# Patient Record
Sex: Male | Born: 1965 | Race: White | Hispanic: No | Marital: Married | Smoking: Never smoker
Health system: Southern US, Community
[De-identification: ages and names within clinical notes are randomized; demographics above are authoritative.]

## PROBLEM LIST (undated history)

## (undated) DIAGNOSIS — R51 Headache: Secondary | ICD-10-CM

## (undated) DIAGNOSIS — I10 Essential (primary) hypertension: Secondary | ICD-10-CM

## (undated) DIAGNOSIS — J189 Pneumonia, unspecified organism: Secondary | ICD-10-CM

## (undated) DIAGNOSIS — N189 Chronic kidney disease, unspecified: Secondary | ICD-10-CM

## (undated) HISTORY — DX: Headache: R51

## (undated) HISTORY — PX: INGUINAL HERNIA REPAIR: SUR1180

## (undated) HISTORY — DX: Chronic kidney disease, unspecified: N18.9

## (undated) HISTORY — PX: OTHER SURGICAL HISTORY: SHX169

---

## 2009-06-25 ENCOUNTER — Encounter: Admission: RE | Admit: 2009-06-25 | Discharge: 2009-06-25 | Payer: Self-pay | Admitting: Internal Medicine

## 2010-07-21 HISTORY — PX: PELVIC FRACTURE SURGERY: SHX119

## 2010-08-11 ENCOUNTER — Encounter: Payer: Self-pay | Admitting: Internal Medicine

## 2011-04-05 ENCOUNTER — Emergency Department (HOSPITAL_BASED_OUTPATIENT_CLINIC_OR_DEPARTMENT_OTHER): Payer: BC Managed Care – PPO

## 2011-04-05 ENCOUNTER — Emergency Department (INDEPENDENT_AMBULATORY_CARE_PROVIDER_SITE_OTHER): Payer: BC Managed Care – PPO

## 2011-04-05 ENCOUNTER — Emergency Department (HOSPITAL_BASED_OUTPATIENT_CLINIC_OR_DEPARTMENT_OTHER)
Admission: EM | Admit: 2011-04-05 | Discharge: 2011-04-05 | Disposition: A | Discharge status: Other Acute Inpatient Hospital | Payer: BC Managed Care – PPO | Attending: Emergency Medicine | Admitting: Emergency Medicine

## 2011-04-05 ENCOUNTER — Encounter: Payer: Self-pay | Admitting: Emergency Medicine

## 2011-04-05 DIAGNOSIS — S3729XA Other injury of bladder, initial encounter: Secondary | ICD-10-CM

## 2011-04-05 DIAGNOSIS — S322XXA Fracture of coccyx, initial encounter for closed fracture: Secondary | ICD-10-CM

## 2011-04-05 DIAGNOSIS — S329XXA Fracture of unspecified parts of lumbosacral spine and pelvis, initial encounter for closed fracture: Secondary | ICD-10-CM | POA: Insufficient documentation

## 2011-04-05 DIAGNOSIS — S36899A Unspecified injury of other intra-abdominal organs, initial encounter: Secondary | ICD-10-CM

## 2011-04-05 DIAGNOSIS — I10 Essential (primary) hypertension: Secondary | ICD-10-CM | POA: Insufficient documentation

## 2011-04-05 DIAGNOSIS — Y9239 Other specified sports and athletic area as the place of occurrence of the external cause: Secondary | ICD-10-CM | POA: Insufficient documentation

## 2011-04-05 DIAGNOSIS — S3720XA Unspecified injury of bladder, initial encounter: Secondary | ICD-10-CM | POA: Insufficient documentation

## 2011-04-05 DIAGNOSIS — R109 Unspecified abdominal pain: Secondary | ICD-10-CM

## 2011-04-05 DIAGNOSIS — J3489 Other specified disorders of nose and nasal sinuses: Secondary | ICD-10-CM

## 2011-04-05 DIAGNOSIS — S3730XA Unspecified injury of urethra, initial encounter: Secondary | ICD-10-CM | POA: Insufficient documentation

## 2011-04-05 HISTORY — DX: Pneumonia, unspecified organism: J18.9

## 2011-04-05 HISTORY — DX: Essential (primary) hypertension: I10

## 2011-04-05 LAB — URINE MICROSCOPIC-ADD ON

## 2011-04-05 LAB — URINALYSIS, ROUTINE W REFLEX MICROSCOPIC
Bilirubin Urine: NEGATIVE
Hgb urine dipstick: NEGATIVE
Nitrite: NEGATIVE
Protein, ur: 30 mg/dL — AB
Specific Gravity, Urine: 1.018 (ref 1.005–1.030)
Urobilinogen, UA: 0.2 mg/dL (ref 0.0–1.0)

## 2011-04-05 MED ORDER — IOHEXOL 300 MG/ML  SOLN
100.0000 mL | Freq: Once | INTRAMUSCULAR | Status: AC | PRN
  Administered 2011-04-05: 100 mL via INTRAVENOUS

## 2011-04-05 MED ORDER — TETANUS-DIPHTH-ACELL PERTUSSIS 5-2.5-18.5 LF-MCG/0.5 IM SUSP
INTRAMUSCULAR | Status: AC
  Filled 2011-04-05: qty 0.5

## 2011-04-05 MED ORDER — TETANUS-DIPHTHERIA TOXOIDS TD 5-2 LFU IM INJ
0.5000 mL | INJECTION | Freq: Once | INTRAMUSCULAR | Status: AC
  Administered 2011-04-05: 0.5 mL via INTRAMUSCULAR
  Filled 2011-04-05: qty 0.5

## 2011-04-05 MED ORDER — SODIUM CHLORIDE 0.9 % IV SOLN
Freq: Once | INTRAVENOUS | Status: AC
  Administered 2011-04-05: 13:00:00 via INTRAVENOUS

## 2011-04-05 MED ORDER — SODIUM CHLORIDE 0.9 % IV SOLN: Freq: Once | INTRAVENOUS | Status: DC

## 2011-04-05 NOTE — ED Provider Notes (Signed)
History/physical exam/procedure(s) were performed by non-physician practitioner and as supervising physician I was immediately available for consultation/collaboration. I have reviewed all notes and am in agreement with care and plan.   Hilario Quarry, MD 04/05/11 [phone]

## 2011-04-05 NOTE — ED Notes (Signed)
Family at bedside.Care plan reviewed with patient
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
## 2012-11-04 ENCOUNTER — Ambulatory Visit (INDEPENDENT_AMBULATORY_CARE_PROVIDER_SITE_OTHER): Payer: BC Managed Care – PPO | Admitting: Family Medicine

## 2012-11-04 ENCOUNTER — Encounter: Payer: Self-pay | Admitting: Family Medicine

## 2012-11-04 VITALS — BP 138/78 | HR 72 | Temp 98.6°F | Resp 12 | Ht 69.0 in | Wt 215.0 lb

## 2012-11-04 DIAGNOSIS — I10 Essential (primary) hypertension: Secondary | ICD-10-CM
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
## 2012-11-05 ENCOUNTER — Encounter: Payer: Self-pay | Admitting: Family Medicine

## 2012-11-05 DIAGNOSIS — I10 Essential (primary) hypertension: Secondary | ICD-10-CM | POA: Insufficient documentation
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
## 2013-02-22 ENCOUNTER — Other Ambulatory Visit (INDEPENDENT_AMBULATORY_CARE_PROVIDER_SITE_OTHER): Payer: BC Managed Care – PPO

## 2013-02-22 ENCOUNTER — Encounter: Payer: Self-pay | Admitting: Family Medicine

## 2013-02-22 DIAGNOSIS — Z0000 Encounter for general adult medical examination without abnormal findings: Secondary | ICD-10-CM

## 2013-02-22 DIAGNOSIS — I10 Essential (primary) hypertension: Secondary | ICD-10-CM

## 2013-02-22 LAB — LIPID PANEL
LDL Cholesterol: 101 mg/dL — ABNORMAL HIGH (ref 0–99)
Total CHOL/HDL Ratio: 4
Triglycerides: 185 mg/dL — ABNORMAL HIGH (ref 0.0–149.0)

## 2013-02-22 LAB — CBC WITH DIFFERENTIAL/PLATELET
Basophils Relative: 0.5 % (ref 0.0–3.0)
Eosinophils Relative: 2.2 % (ref 0.0–5.0)
HCT: 43.2 % (ref 39.0–52.0)
Hemoglobin: 14.7 g/dL (ref 13.0–17.0)
Lymphocytes Relative: 38.4 % (ref 12.0–46.0)
Lymphs Abs: 2 10*3/uL (ref 0.7–4.0)
Monocytes Relative: 8.3 % (ref 3.0–12.0)
Neutro Abs: 2.6 10*3/uL (ref 1.4–7.7)
RBC: 4.69 Mil/uL (ref 4.22–5.81)
RDW: 12.9 % (ref 11.5–14.6)

## 2013-02-22 LAB — POCT URINALYSIS DIPSTICK
Bilirubin, UA: NEGATIVE
Blood, UA: NEGATIVE
Glucose, UA: NEGATIVE
Leukocytes, UA: NEGATIVE
Nitrite, UA: NEGATIVE
Urobilinogen, UA: 0.2

## 2013-02-22 LAB — BASIC METABOLIC PANEL
BUN: 13 mg/dL (ref 6–23)
CO2: 26 mEq/L (ref 19–32)
Calcium: 9.7 mg/dL (ref 8.4–10.5)
Chloride: 102 mEq/L (ref 96–112)
Creatinine, Ser: 1.2 mg/dL (ref 0.4–1.5)
Glucose, Bld: 93 mg/dL (ref 70–99)

## 2013-02-22 LAB — HEPATIC FUNCTION PANEL
ALT: 39 U/L (ref 0–53)
Total Bilirubin: 0.6 mg/dL (ref 0.3–1.2)
Total Protein: 7.6 g/dL (ref 6.0–8.3)

## 2013-02-28 ENCOUNTER — Encounter: Payer: Self-pay | Admitting: Family Medicine

## 2013-02-28 ENCOUNTER — Ambulatory Visit (INDEPENDENT_AMBULATORY_CARE_PROVIDER_SITE_OTHER): Payer: BC Managed Care – PPO | Admitting: Family Medicine

## 2013-02-28 VITALS — BP 136/78 | HR 87 | Temp 98.6°F | Ht 69.0 in | Wt 211.0 lb

## 2013-02-28 DIAGNOSIS — Z0000 Encounter for general adult medical examination without abnormal findings: Secondary | ICD-10-CM

## 2013-02-28 MED ORDER — OLMESARTAN MEDOXOMIL 20 MG PO TABS: 20.0000 mg | ORAL_TABLET | Freq: Every day | ORAL | Status: DC
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
## 2013-02-28 NOTE — Patient Instructions (Addendum)
Hypertriglyceridemia  Diet for High blood levels of Triglycerides Most fats in food are triglycerides. Triglycerides in your blood are stored as fat in your body. High levels of triglycerides in your blood may put you at a greater risk for heart disease and stroke.  Normal triglyceride levels are less than 150 mg/dL. Borderline high levels are 150-199 mg/dl. High levels are 200 - 499 mg/dL, and very high triglyceride levels are greater than 500 mg/dL. The decision to treat high triglycerides is generally based on the level. For people with borderline or high triglyceride levels, treatment includes weight loss and exercise. Drugs are recommended for people with very high triglyceride levels. Many people who need treatment for high triglyceride levels have metabolic syndrome. This syndrome is a collection of disorders that often include: insulin resistance, high blood pressure, blood clotting problems, high cholesterol and triglycerides. TESTING PROCEDURE FOR TRIGLYCERIDES  You should not eat 4 hours before getting your triglycerides measured. The normal range of triglycerides is between 10 and 250 milligrams per deciliter (mg/dl). Some people may have extreme levels (1000 or above), but your triglyceride level may be too high if it is above 150 mg/dl, depending on what other risk factors you have for heart disease.  People with high blood triglycerides may also have high blood cholesterol levels. If you have high blood cholesterol as well as high blood triglycerides, your risk for heart disease is probably greater than if you only had high triglycerides. High blood cholesterol is one of the main risk factors for heart disease. CHANGING YOUR DIET  Your weight can affect your blood triglyceride level. If you are more than 20% above your ideal body weight, you may be able to lower your blood triglycerides by losing weight. Eating less and exercising regularly is the best way to combat this. Fat provides more  calories than any other food. The best way to lose weight is to eat less fat. Only 30% of your total calories should come from fat. Less than 7% of your diet should come from saturated fat. A diet low in fat and saturated fat is the same as a diet to decrease blood cholesterol. By eating a diet lower in fat, you may lose weight, lower your blood cholesterol, and lower your blood triglyceride level.  Eating a diet low in fat, especially saturated fat, may also help you lower your blood triglyceride level. Ask your dietitian to help you figure how much fat you can eat based on the number of calories your caregiver has prescribed for you.  Exercise, in addition to helping with weight loss may also help lower triglyceride levels.   Alcohol can increase blood triglycerides. You may need to stop drinking alcoholic beverages.  Too much carbohydrate in your diet may also increase your blood triglycerides. Some complex carbohydrates are necessary in your diet. These may include bread, rice, potatoes, other starchy vegetables and cereals.  Reduce "simple" carbohydrates. These may include pure sugars, candy, honey, and jelly without losing other nutrients. If you have the kind of high blood triglycerides that is affected by the amount of carbohydrates in your diet, you will need to eat less sugar and less high-sugar foods. Your caregiver can help you with this.  Adding 2-4 grams of fish oil (EPA+ DHA) may also help lower triglycerides. Speak with your caregiver before adding any supplements to your regimen. Following the Diet  Maintain your ideal weight. Your caregivers can help you with a diet. Generally, eating less food and getting more   exercise will help you lose weight. Joining a weight control group may also help. Ask your caregivers for a good weight control group in your area.  Eat low-fat foods instead of high-fat foods. This can help you lose weight too.  These foods are lower in fat. Eat MORE of these:    Dried beans, peas, and lentils.  Egg whites.  Low-fat cottage cheese.  Fish.  Lean cuts of meat, such as round, sirloin, rump, and flank (cut extra fat off meat you fix).  Whole grain breads, cereals and pasta.  Skim and nonfat dry milk.  Low-fat yogurt.  Poultry without the skin.  Cheese made with skim or part-skim milk, such as mozzarella, parmesan, farmers', ricotta, or pot cheese. These are higher fat foods. Eat LESS of these:   Whole milk and foods made from whole milk, such as American, blue, cheddar, monterey jack, and swiss cheese  High-fat meats, such as luncheon meats, sausages, knockwurst, bratwurst, hot dogs, ribs, corned beef, ground pork, and regular ground beef.  Fried foods. Limit saturated fats in your diet. Substituting unsaturated fat for saturated fat may decrease your blood triglyceride level. You will need to read package labels to know which products contain saturated fats.  These foods are high in saturated fat. Eat LESS of these:   Fried pork skins.  Whole milk.  Skin and fat from poultry.  Palm oil.  Butter.  Shortening.  Cream cheese.  Bacon.  Margarines and baked goods made from listed oils.  Vegetable shortenings.  Chitterlings.  Fat from meats.  Coconut oil.  Palm kernel oil.  Lard.  Cream.  Sour cream.  Fatback.  Coffee whiteners and non-dairy creamers made with these oils.  Cheese made from whole milk. Use unsaturated fats (both polyunsaturated and monounsaturated) moderately. Remember, even though unsaturated fats are better than saturated fats; you still want a diet low in total fat.  These foods are high in unsaturated fat:   Canola oil.  Sunflower oil.  Mayonnaise.  Almonds.  Peanuts.  Pine nuts.  Margarines made with these oils.  Safflower oil.  Olive oil.  Avocados.  Cashews.  Peanut butter.  Sunflower seeds.  Soybean oil.  Peanut  oil.  Olives.  Pecans.  Walnuts.  Pumpkin seeds. Avoid sugar and other high-sugar foods. This will decrease carbohydrates without decreasing other nutrients. Sugar in your food goes rapidly to your blood. When there is excess sugar in your blood, your liver may use it to make more triglycerides. Sugar also contains calories without other important nutrients.  Eat LESS of these:   Sugar, brown sugar, powdered sugar, jam, jelly, preserves, honey, syrup, molasses, pies, candy, cakes, cookies, frosting, pastries, colas, soft drinks, punches, fruit drinks, and regular gelatin.  Avoid alcohol. Alcohol, even more than sugar, may increase blood triglycerides. In addition, alcohol is high in calories and low in nutrients. Ask for sparkling water, or a diet soft drink instead of an alcoholic beverage. Suggestions for planning and preparing meals   Bake, broil, grill or roast meats instead of frying.  Remove fat from meats and skin from poultry before cooking.  Add spices, herbs, lemon juice or vinegar to vegetables instead of salt, rich sauces or gravies.  Use a non-stick skillet without fat or use no-stick sprays.  Cool and refrigerate stews and broth. Then remove the hardened fat floating on the surface before serving.  Refrigerate meat drippings and skim off fat to make low-fat gravies.  Serve more fish.  Use less butter,   margarine and other high-fat spreads on bread or vegetables.  Use skim or reconstituted non-fat dry milk for cooking.  Cook with low-fat cheeses.  Substitute low-fat yogurt or cottage cheese for all or part of the sour cream in recipes for sauces, dips or congealed salads.  Use half yogurt/half mayonnaise in salad recipes.  Substitute evaporated skim milk for cream. Evaporated skim milk or reconstituted non-fat dry milk can be whipped and substituted for whipped cream in certain recipes.  Choose fresh fruits for dessert instead of high-fat foods such as pies or  cakes. Fruits are naturally low in fat. When Dining Out   Order low-fat appetizers such as fruit or vegetable juice, pasta with vegetables or tomato sauce.  Select clear, rather than cream soups.  Ask that dressings and gravies be served on the side. Then use less of them.  Order foods that are baked, broiled, poached, steamed, stir-fried, or roasted.  Ask for margarine instead of butter, and use only a small amount.  Drink sparkling water, unsweetened tea or coffee, or diet soft drinks instead of alcohol or other sweet beverages. QUESTIONS AND ANSWERS ABOUT OTHER FATS IN THE BLOOD: SATURATED FAT, TRANS FAT, AND CHOLESTEROL What is trans fat? Trans fat is a type of fat that is formed when vegetable oil is hardened through a process called hydrogenation. This process helps makes foods more solid, gives them shape, and prolongs their shelf life. Trans fats are also called hydrogenated or partially hydrogenated oils.  What do saturated fat, trans fat, and cholesterol in foods have to do with heart disease? Saturated fat, trans fat, and cholesterol in the diet all raise the level of LDL "bad" cholesterol in the blood. The higher the LDL cholesterol, the greater the risk for coronary heart disease (CHD). Saturated fat and trans fat raise LDL similarly.  What foods contain saturated fat, trans fat, and cholesterol? High amounts of saturated fat are found in animal products, such as fatty cuts of meat, chicken skin, and full-fat dairy products like butter, whole milk, cream, and cheese, and in tropical vegetable oils such as palm, palm kernel, and coconut oil. Trans fat is found in some of the same foods as saturated fat, such as vegetable shortening, some margarines (especially hard or stick margarine), crackers, cookies, baked goods, fried foods, salad dressings, and other processed foods made with partially hydrogenated vegetable oils. Small amounts of trans fat also occur naturally in some animal  products, such as milk products, beef, and lamb. Foods high in cholesterol include liver, other organ meats, egg yolks, shrimp, and full-fat dairy products. How can I use the new food label to make heart-healthy food choices? Check the Nutrition Facts panel of the food label. Choose foods lower in saturated fat, trans fat, and cholesterol. For saturated fat and cholesterol, you can also use the Percent Daily Value (%DV): 5% DV or less is low, and 20% DV or more is high. (There is no %DV for trans fat.) Use the Nutrition Facts panel to choose foods low in saturated fat and cholesterol, and if the trans fat is not listed, read the ingredients and limit products that list shortening or hydrogenated or partially hydrogenated vegetable oil, which tend to be high in trans fat. POINTS TO REMEMBER:   Discuss your risk for heart disease with your caregivers, and take steps to reduce risk factors.  Change your diet. Choose foods that are low in saturated fat, trans fat, and cholesterol.  Add exercise to your daily routine if   it is not already being done. Participate in physical activity of moderate intensity, like brisk walking, for at least 30 minutes on most, and preferably all days of the week. No time? Break the 30 minutes into three, 10-minute segments during the day.  Stop smoking. If you do smoke, contact your caregiver to discuss ways in which they can help you quit.  Do not use street drugs.  Maintain a normal weight.  Maintain a healthy blood pressure.  Keep up with your blood work for checking the fats in your blood as directed by your caregiver. Document Released: 04/24/2004 Document Revised: 01/06/2012 Document Reviewed: 11/20/2008 [employer] Patient Information 2014 [employer].  

## 2013-09-08 ENCOUNTER — Encounter (INDEPENDENT_AMBULATORY_CARE_PROVIDER_SITE_OTHER): Payer: Self-pay | Admitting: General Surgery

## 2013-09-08 ENCOUNTER — Encounter (INDEPENDENT_AMBULATORY_CARE_PROVIDER_SITE_OTHER): Payer: Self-pay

## 2013-09-08 ENCOUNTER — Ambulatory Visit (INDEPENDENT_AMBULATORY_CARE_PROVIDER_SITE_OTHER): Payer: BC Managed Care – PPO | Admitting: General Surgery

## 2013-09-08 VITALS — BP 112/64 | HR 66 | Temp 98.1°F | Resp 12 | Ht 69.0 in | Wt 205.2 lb

## 2013-09-08 DIAGNOSIS — K4091 Unilateral inguinal hernia, without obstruction or gangrene, recurrent: Secondary | ICD-10-CM | POA: Insufficient documentation
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
## 2013-09-19 ENCOUNTER — Ambulatory Visit (INDEPENDENT_AMBULATORY_CARE_PROVIDER_SITE_OTHER): Payer: BC Managed Care – PPO | Admitting: Family Medicine

## 2013-09-19 ENCOUNTER — Encounter: Payer: Self-pay | Admitting: Family Medicine

## 2013-09-19 VITALS — BP 116/70 | HR 76 | Wt 203.0 lb

## 2013-09-19 DIAGNOSIS — Z7189 Other specified counseling: Secondary | ICD-10-CM

## 2013-09-19 DIAGNOSIS — Z7184 Encounter for health counseling related to travel: Secondary | ICD-10-CM

## 2013-09-19 DIAGNOSIS — Z23 Encounter for immunization: Secondary | ICD-10-CM

## 2013-09-19 DIAGNOSIS — I10 Essential (primary) hypertension: Secondary | ICD-10-CM

## 2013-09-19 MED ORDER — TYPHOID VACCINE PO CPDR: DELAYED_RELEASE_CAPSULE | ORAL | Status: DC

## 2013-09-19 NOTE — Progress Notes (Signed)
Pre visit review using our clinic review tool, if applicable. No additional management support is needed unless otherwise documented below in the visit note. 
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
## 2013-09-20 ENCOUNTER — Telehealth: Payer: Self-pay | Admitting: Family Medicine

## 2013-09-20 NOTE — Telephone Encounter (Signed)
Relevant patient education assigned to patient using Emmi. ° °

## 2013-09-30 ENCOUNTER — Other Ambulatory Visit (INDEPENDENT_AMBULATORY_CARE_PROVIDER_SITE_OTHER): Payer: Self-pay | Admitting: *Deleted

## 2013-09-30 DIAGNOSIS — K4091 Unilateral inguinal hernia, without obstruction or gangrene, recurrent: Secondary | ICD-10-CM

## 2013-09-30 MED ORDER — OXYCODONE-ACETAMINOPHEN 5-325 MG PO TABS: ORAL_TABLET | ORAL | Status: DC

## 2013-10-14 ENCOUNTER — Encounter (INDEPENDENT_AMBULATORY_CARE_PROVIDER_SITE_OTHER): Payer: Self-pay | Admitting: General Surgery

## 2013-10-14 ENCOUNTER — Ambulatory Visit (INDEPENDENT_AMBULATORY_CARE_PROVIDER_SITE_OTHER): Payer: BC Managed Care – PPO | Admitting: General Surgery

## 2013-10-14 DIAGNOSIS — K4091 Unilateral inguinal hernia, without obstruction or gangrene, recurrent: Secondary | ICD-10-CM

## 2013-10-14 NOTE — Patient Instructions (Signed)
You are recovering from your a laparoscopic repair of recurrent right inguinal hernia without any obvious problems. The hernia repair seems to be intact.  No sports or heavy lifting for 6 weeks from the date of surgery  Return to see Dr. Dalbert Batman if necessary.

## 2013-10-14 NOTE — Progress Notes (Signed)
Patient ID: Jose Kerr, male   DOB: 1965/11/13, 48 y.o.   MRN: [id number] History: This gentleman underwent laparoscopic repair of recurrent right inguinal hernia with mesh on 09/30/2013. He is doing well. No real wound problems. Minimal pain.  Exam: Umbilical incision healing normally. Exam of right groin standing shows no seroma, no infection, and hernia repair appears intact  Assessment: Recurrent right inguinal hernia, recovering uneventfully following laparoscopic repair with mesh  Plan: No sports, prolonged mountain  hikes, or heavy lifting for 6 weeks from the date of surgery Lots of walking prior to that time Return to see me as necessary.   Edsel Petrin. Dalbert Batman, M.D., [facility] General and Minimally invasive Surgery Breast and Colorectal Surgery Office:   [phone] Pager:   [phone]

## 2013-11-14 ENCOUNTER — Ambulatory Visit (INDEPENDENT_AMBULATORY_CARE_PROVIDER_SITE_OTHER): Payer: BC Managed Care – PPO | Admitting: Family Medicine

## 2013-11-14 ENCOUNTER — Encounter: Payer: Self-pay | Admitting: Family Medicine

## 2013-11-14 VITALS — BP 148/82 | HR 84 | Temp 98.2°F | Wt 209.0 lb

## 2013-11-14 DIAGNOSIS — I10 Essential (primary) hypertension: Secondary | ICD-10-CM

## 2013-11-14 DIAGNOSIS — R42 Dizziness and giddiness: Secondary | ICD-10-CM

## 2013-11-14 NOTE — Progress Notes (Signed)
Subjective:    Patient ID: Jose Kerr, male    DOB: 08-Apr-1966, 48 y.o.   MRN: [id number]  HPI Comments: Patient is a 49 year old male presenting for hypertension followup appointment. Currently patient is taking Benicar to manage pressure, but has self-medicated up to 30mg  yesterday and today.  Patient states he has had limited exercise reigmen due to recent hernia surgery last month. He recently began working out again and reports a current uncomfortable chest sensation. Scribes as tight, does not radiate, constant for a few days, and sore to touch.   He has gained 5-8 pounds since the surgery and wants to drop 40 pounds. He has had no change in his diet.   Patient was taking ibuprofen after his recent surgery he thinks could have contributed to his increase in blood pressure. He states for the past 2 weeks his blood pressure has ranged from 150-155/ 80. He has been off the ibuprofen for about 5 days and said he has had what he believes is a rebound headache described as tension-like which starts in his neck. His last headache was 4 days ago.   This past weekend, while shopping with his wife he felt his heart flutter and came lightheaded; he felt like he was going to blackout while he was standing. He did not pass out. Patient reports moving and sitting down helped relieve this lightheaded sensation. Patient denies past medical history of arrhythmia. Patient reports drinking a lot of green tea recently and thinks caffeine could be a contributor.  He states when he was younger he was able to control his heart rate and his pupil construction and dilation.  Hypertension This is a chronic problem. The current episode started more than 1 year ago. The problem is uncontrolled. Associated symptoms include headaches, malaise/fatigue and palpitations. Pertinent negatives include no blurred vision, chest pain or shortness of breath. Agents associated with hypertension include decongestants. Past  treatments include diuretics and angiotensin blockers. There are no compliance problems.       Review of Systems  Constitutional: Positive for malaise/fatigue, activity change and fatigue. Negative for fever.  Eyes: Negative for blurred vision and visual disturbance.  Respiratory: Negative for cough and shortness of breath.   Cardiovascular: Positive for palpitations. Negative for chest pain.  Gastrointestinal: Negative for nausea, vomiting, abdominal pain, diarrhea and constipation.  Endocrine: Negative for polydipsia and polyuria.  Genitourinary: Positive for penile pain.  Neurological: Positive for light-headedness and headaches. Negative for dizziness, syncope and weakness.       Objective:   Physical Exam  Constitutional: He appears well-developed and well-nourished.  HENT:  Head: Normocephalic.  Eyes: Pupils are equal, round, and reactive to light.  Cardiovascular: Normal rate, regular rhythm and normal heart sounds.   Measured subsequent blood pressure at 130/84, left arm, sitting.  Pulmonary/Chest: Effort normal and breath sounds normal.  Musculoskeletal:       Right lower leg: He exhibits no swelling and no edema.       Left lower leg: He exhibits no swelling and no edema.  Neurological: He is alert. He has normal strength. Coordination and gait normal.  Skin: Skin is warm and dry.          Assessment & Plan:  1. Hypertension Increase Benicar dosage from 20mg  to 40mg . Patient given Benicar samples. Will consider adding HCTZ in the future. Patient given educational handout on hypertension and on the dash diet.  2. Dizziness Consider event monitor in future if dizziness does not subside.  [place], PA-S   As above.  Titrate up Benicar.  He plans to lose some weight and this should help his BP. Event monitor for any recurrent dizziness.  No recent syncope.  Carolann Littler, MD

## 2013-11-14 NOTE — Patient Instructions (Signed)
Hypertension  As your heart beats, it forces blood through your arteries. This force is your blood pressure. If the pressure is too high, it is called hypertension (HTN) or high blood pressure. HTN is dangerous because you may have it and not know it. High blood pressure may mean that your heart has to work harder to pump blood. Your arteries may be narrow or stiff. The extra work puts you at risk for heart disease, stroke, and other problems.   Blood pressure consists of two numbers, a higher number over a lower, 110/72, for example. It is stated as "110 over 72." The ideal is below 120 for the top number (systolic) and under 80 for the bottom (diastolic). Write down your blood pressure today.  You should pay close attention to your blood pressure if you have certain conditions such as:   Heart failure.   Prior heart attack.   Diabetes   Chronic kidney disease.   Prior stroke.   Multiple risk factors for heart disease.  To see if you have HTN, your blood pressure should be measured while you are seated with your arm held at the level of the heart. It should be measured at least twice. A one-time elevated blood pressure reading (especially in the Emergency Department) does not mean that you need treatment. There may be conditions in which the blood pressure is different between your right and left arms. It is important to see your caregiver soon for a recheck.  Most people have essential hypertension which means that there is not a specific cause. This type of high blood pressure may be lowered by changing lifestyle factors such as:   Stress.   Smoking.   Lack of exercise.   Excessive weight.   Drug/tobacco/alcohol use.   Eating less salt.  Most people do not have symptoms from high blood pressure until it has caused damage to the body. Effective treatment can often prevent, delay or reduce that damage.  TREATMENT    When a cause has been identified, treatment for high blood pressure is directed at the cause. There are a large number of medications to treat HTN. These fall into several categories, and your caregiver will help you select the medicines that are best for you. Medications may have side effects. You should review side effects with your caregiver.  If your blood pressure stays high after you have made lifestyle changes or started on medicines,    Your medication(s) may need to be changed.   Other problems may need to be addressed.   Be certain you understand your prescriptions, and know how and when to take your medicine.   Be sure to follow up with your caregiver within the time frame advised (usually within two weeks) to have your blood pressure rechecked and to review your medications.   If you are taking more than one medicine to lower your blood pressure, make sure you know how and at what times they should be taken. Taking two medicines at the same time can result in blood pressure that is too low.  SEEK IMMEDIATE MEDICAL CARE IF:   You develop a severe headache, blurred or changing vision, or confusion.   You have unusual weakness or numbness, or a faint feeling.   You have severe chest or abdominal pain, vomiting, or breathing problems.  MAKE SURE YOU:    Understand these instructions.   Will watch your condition.   Will get help right away if you are not doing well   or get worse.  Document Released: 07/07/2005 Document Revised: 09/29/2011 Document Reviewed: 02/25/2008  ExitCare Patient Information 2014 [employer].  DASH Diet   The DASH diet stands for "Dietary Approaches to Stop Hypertension." It is a healthy eating plan that has been shown to reduce high blood pressure (hypertension) in as little as 14 days, while also possibly providing other significant health benefits. These other health benefits include reducing the risk of breast cancer after menopause and reducing the risk of type 2 diabetes, heart disease, colon cancer, and stroke. Health benefits also include weight loss and slowing kidney failure in patients with chronic kidney disease.   DIET GUIDELINES   Limit salt (sodium). Your diet should contain less than 1500 mg of sodium daily.   Limit refined or processed carbohydrates. Your diet should include mostly whole grains. Desserts and added sugars should be used sparingly.   Include small amounts of heart-healthy fats. These types of fats include nuts, oils, and tub margarine. Limit saturated and trans fats. These fats have been shown to be harmful in the body.  CHOOSING FOODS   The following food groups are based on a 2000 calorie diet. See your Registered Dietitian for individual calorie needs.  Grains and Grain Products (6 to 8 servings daily)   Eat More Often: Whole-wheat bread, brown rice, whole-grain or wheat pasta, quinoa, popcorn without added fat or salt (air popped).   Eat Less Often: White bread, white pasta, white rice, cornbread.  Vegetables (4 to 5 servings daily)   Eat More Often: Fresh, frozen, and canned vegetables. Vegetables may be raw, steamed, roasted, or grilled with a minimal amount of fat.   Eat Less Often/Avoid: Creamed or fried vegetables. Vegetables in a cheese sauce.  Fruit (4 to 5 servings daily)   Eat More Often: All fresh, canned (in natural juice), or frozen fruits. Dried fruits without added sugar. One hundred percent fruit juice ( cup [237 mL] daily).   Eat Less Often: Dried fruits with added sugar. Canned fruit in light or heavy syrup.   Lean Meats, Fish, and Poultry (2 servings or less daily. One serving is 3 to 4 oz [85-114 g]).   Eat More Often: Ninety percent or leaner ground beef, tenderloin, sirloin. Round cuts of beef, chicken breast, turkey breast. All fish. Grill, bake, or broil your meat. Nothing should be fried.   Eat Less Often/Avoid: Fatty cuts of meat, turkey, or chicken leg, thigh, or wing. Fried cuts of meat or fish.  Dairy (2 to 3 servings)   Eat More Often: Low-fat or fat-free milk, low-fat plain or light yogurt, reduced-fat or part-skim cheese.   Eat Less Often/Avoid: Milk (whole, 2%).Whole milk yogurt. Full-fat cheeses.  Nuts, Seeds, and Legumes (4 to 5 servings per week)   Eat More Often: All without added salt.   Eat Less Often/Avoid: Salted nuts and seeds, canned beans with added salt.  Fats and Sweets (limited)   Eat More Often: Vegetable oils, tub margarines without trans fats, sugar-free gelatin. Mayonnaise and salad dressings.   Eat Less Often/Avoid: Coconut oils, palm oils, butter, stick margarine, cream, half and half, cookies, candy, pie.  FOR MORE INFORMATION  The Dash Diet Eating Plan: www.dashdiet.org  Document Released: 06/26/2011 Document Revised: 09/29/2011 Document Reviewed: 06/26/2011  [employer]Care Patient Information 2014 [employer].

## 2013-11-14 NOTE — Progress Notes (Signed)
Pre visit review using our clinic review tool, if applicable. No additional management support is needed unless otherwise documented below in the visit note. 

## 2014-03-16 ENCOUNTER — Other Ambulatory Visit (INDEPENDENT_AMBULATORY_CARE_PROVIDER_SITE_OTHER): Payer: BC Managed Care – PPO

## 2014-03-16 DIAGNOSIS — Z0000 Encounter for general adult medical examination without abnormal findings: Secondary | ICD-10-CM

## 2014-03-16 LAB — LIPID PANEL
CHOL/HDL RATIO: 2
Cholesterol: 134 mg/dL (ref 0–200)
HDL: 54.7 mg/dL (ref >39.00)
LDL Cholesterol: 66 mg/dL (ref 0–99)
NONHDL: 79.3
Triglycerides: 68 mg/dL (ref 0.0–149.0)
VLDL: 13.6 mg/dL (ref 0.0–40.0)

## 2014-03-16 LAB — POCT URINALYSIS DIPSTICK
Bilirubin, UA: NEGATIVE
Glucose, UA: NEGATIVE
KETONES UA: NEGATIVE
Leukocytes, UA: NEGATIVE
Nitrite, UA: NEGATIVE
PROTEIN UA: NEGATIVE
RBC UA: NEGATIVE
SPEC GRAV UA: 1.01
UROBILINOGEN UA: 0.2
pH, UA: 7

## 2014-03-16 LAB — CBC WITH DIFFERENTIAL/PLATELET
BASOS PCT: 0.6 % (ref 0.0–3.0)
Basophils Absolute: 0 10*3/uL (ref 0.0–0.1)
EOS PCT: 0.9 % (ref 0.0–5.0)
Eosinophils Absolute: 0 10*3/uL (ref 0.0–0.7)
HEMATOCRIT: 41 % (ref 39.0–52.0)
HEMOGLOBIN: 14 g/dL (ref 13.0–17.0)
LYMPHS ABS: 1.1 10*3/uL (ref 0.7–4.0)
Lymphocytes Relative: 29.5 % (ref 12.0–46.0)
MCHC: 34.2 g/dL (ref 30.0–36.0)
MCV: 92.9 fl (ref 78.0–100.0)
MONO ABS: 0.4 10*3/uL (ref 0.1–1.0)
MONOS PCT: 9.8 % (ref 3.0–12.0)
NEUTROS ABS: 2.3 10*3/uL (ref 1.4–7.7)
Neutrophils Relative %: 59.2 % (ref 43.0–77.0)
PLATELETS: 199 10*3/uL (ref 150.0–400.0)
RBC: 4.41 Mil/uL (ref 4.22–5.81)
RDW: 13.1 % (ref 11.5–15.5)
WBC: 3.9 10*3/uL — AB (ref 4.0–10.5)

## 2014-03-16 LAB — BASIC METABOLIC PANEL
BUN: 18 mg/dL (ref 6–23)
CALCIUM: 9.6 mg/dL (ref 8.4–10.5)
CO2: 28 meq/L (ref 19–32)
CREATININE: 1.5 mg/dL (ref 0.4–1.5)
Chloride: 101 mEq/L (ref 96–112)
GFR: 54.11 mL/min — AB (ref >60.00)
GLUCOSE: 85 mg/dL (ref 70–99)
Potassium: 4.7 mEq/L (ref 3.5–5.1)
Sodium: 138 mEq/L (ref 135–145)

## 2014-03-16 LAB — HEPATIC FUNCTION PANEL
ALT: 26 U/L (ref 0–53)
AST: 31 U/L (ref 0–37)
Albumin: 4.5 g/dL (ref 3.5–5.2)
Alkaline Phosphatase: 57 U/L (ref 39–117)
BILIRUBIN TOTAL: 0.9 mg/dL (ref 0.2–1.2)
Bilirubin, Direct: 0.1 mg/dL (ref 0.0–0.3)
Total Protein: 7.6 g/dL (ref 6.0–8.3)

## 2014-03-16 LAB — TSH: TSH: 1.22 u[IU]/mL (ref 0.35–4.50)

## 2014-03-23 ENCOUNTER — Encounter: Payer: Self-pay | Admitting: Family Medicine

## 2014-03-23 ENCOUNTER — Ambulatory Visit (INDEPENDENT_AMBULATORY_CARE_PROVIDER_SITE_OTHER): Payer: BC Managed Care – PPO | Admitting: Family Medicine

## 2014-03-23 VITALS — BP 126/70 | HR 76 | Temp 97.5°F | Ht 69.0 in | Wt 195.0 lb

## 2014-03-23 DIAGNOSIS — Z23 Encounter for immunization: Secondary | ICD-10-CM

## 2014-03-23 DIAGNOSIS — Z0000 Encounter for general adult medical examination without abnormal findings: Secondary | ICD-10-CM

## 2014-03-23 MED ORDER — OLMESARTAN MEDOXOMIL 20 MG PO TABS: 20.0000 mg | ORAL_TABLET | Freq: Every day | ORAL | Status: DC

## 2014-03-23 NOTE — Progress Notes (Signed)
Pre visit review using our clinic review tool, if applicable. No additional management support is needed unless otherwise documented below in the visit note. 

## 2014-03-23 NOTE — Addendum Note (Signed)
Addended by: Noe Gens E on: 03/23/2014 10:00 AM   Modules accepted: Orders
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
## 2015-01-15 ENCOUNTER — Telehealth: Payer: Self-pay | Admitting: Family Medicine

## 2015-01-15 NOTE — Telephone Encounter (Signed)
Patient Name: Jose Kerr DOB: 06/06/1966 Initial Comment Caller states he has a Tick bite. Embedded, under arm- swelling, and redness. Nurse Assessment Nurse: Marcelline Deist, RN, Lynda Date/Time (Eastern Time): 01/15/2015 1:47:52 PM Confirm and document reason for call. If symptomatic, describe symptoms. ---Caller states he has a Tick bite. The head was embedded, able to get it out under his left arm, in arm pit area. It is sore, his arm feels swollen. The redness around the bite site is about the size of a dime or smaller. He thinks it was a deer tick, noticed irritation on Friday, didn't see the tick until Sunday. Has the patient traveled out of the country within the last 30 days? ---Not Applicable Does the patient require triage? ---Yes Related visit to physician within the last 2 weeks? ---No Does the PT have any chronic conditions? (i.e. diabetes, asthma, etc.) ---Yes List chronic conditions. ---hypertension Guidelines Guideline Title Affirmed Question Affirmed Notes Tick Bite [1] Red or very tender (to touch) area AND [2] started over 24 hours after the bite Final Disposition User See Physician within [place], Therapist, sports, [place]

## 2015-01-15 NOTE — Telephone Encounter (Signed)
Noted patient has appointment to see Dr. Sarajane Jews 01/16/15

## 2015-01-16 ENCOUNTER — Ambulatory Visit (INDEPENDENT_AMBULATORY_CARE_PROVIDER_SITE_OTHER): Payer: BLUE CROSS/BLUE SHIELD | Admitting: Family Medicine

## 2015-01-16 ENCOUNTER — Encounter: Payer: Self-pay | Admitting: Family Medicine

## 2015-01-16 VITALS — BP 136/74 | HR 59 | Temp 98.6°F | Ht 69.0 in | Wt 198.0 lb

## 2015-01-16 DIAGNOSIS — S40862A Insect bite (nonvenomous) of left upper arm, initial encounter: Secondary | ICD-10-CM | POA: Diagnosis not present

## 2015-01-16 DIAGNOSIS — W57XXXA Bitten or stung by nonvenomous insect and other nonvenomous arthropods, initial encounter: Secondary | ICD-10-CM

## 2015-01-16 MED ORDER — DOXYCYCLINE HYCLATE 100 MG PO CAPS: 100.0000 mg | ORAL_CAPSULE | Freq: Two times a day (BID) | ORAL | Status: AC

## 2015-01-16 NOTE — Progress Notes (Signed)
   Subjective:    Patient ID: Jose Kerr, male    DOB: March 09, 1966, 49 y.o.   MRN: [id number]  HPI Here wit concerns about a tick bite. He discovered a tick in the left armpit 3 days ago and pulled it out. Since then the site has been sore and he has aching down the entire left arm. No rashes or fevers.    Review of Systems  Constitutional: Negative.   Respiratory: Negative.   Cardiovascular: Negative.   Neurological: Negative.        Objective:   Physical Exam  Constitutional: He appears well-developed and well-nourished.  Cardiovascular: Normal rate, regular rhythm, normal heart sounds and intact distal pulses.   Pulmonary/Chest: Effort normal and breath sounds normal.  Skin:  The left axilla has a small red tender spot          Assessment & Plan:  Tick bite with regional aching. Cover with Doxycycline.

## 2015-01-16 NOTE — Progress Notes (Signed)
Pre visit review using our clinic review tool, if applicable. No additional management support is needed unless otherwise documented below in the visit note. 

## 2015-03-20 ENCOUNTER — Other Ambulatory Visit (INDEPENDENT_AMBULATORY_CARE_PROVIDER_SITE_OTHER): Payer: BLUE CROSS/BLUE SHIELD

## 2015-03-20 DIAGNOSIS — Z0000 Encounter for general adult medical examination without abnormal findings: Secondary | ICD-10-CM | POA: Diagnosis not present

## 2015-03-20 LAB — CBC WITH DIFFERENTIAL/PLATELET
BASOS ABS: 0 10*3/uL (ref 0.0–0.1)
Basophils Relative: 0.6 % (ref 0.0–3.0)
EOS ABS: 0.1 10*3/uL (ref 0.0–0.7)
Eosinophils Relative: 2.6 % (ref 0.0–5.0)
HCT: 40.2 % (ref 39.0–52.0)
Hemoglobin: 13.6 g/dL (ref 13.0–17.0)
LYMPHS ABS: 1.7 10*3/uL (ref 0.7–4.0)
Lymphocytes Relative: 31.2 % (ref 12.0–46.0)
MCHC: 33.9 g/dL (ref 30.0–36.0)
MCV: 93.9 fl (ref 78.0–100.0)
MONO ABS: 0.5 10*3/uL (ref 0.1–1.0)
Monocytes Relative: 9.7 % (ref 3.0–12.0)
NEUTROS ABS: 3.1 10*3/uL (ref 1.4–7.7)
NEUTROS PCT: 55.9 % (ref 43.0–77.0)
PLATELETS: 216 10*3/uL (ref 150.0–400.0)
RBC: 4.28 Mil/uL (ref 4.22–5.81)
RDW: 12.9 % (ref 11.5–15.5)
WBC: 5.6 10*3/uL (ref 4.0–10.5)

## 2015-03-20 LAB — BASIC METABOLIC PANEL
BUN: 11 mg/dL (ref 6–23)
CALCIUM: 9.6 mg/dL (ref 8.4–10.5)
CO2: 31 meq/L (ref 19–32)
CREATININE: 1.22 mg/dL (ref 0.40–1.50)
Chloride: 101 mEq/L (ref 96–112)
GFR: 66.82 mL/min (ref >60.00)
GLUCOSE: 93 mg/dL (ref 70–99)
Potassium: 4.6 mEq/L (ref 3.5–5.1)
Sodium: 139 mEq/L (ref 135–145)

## 2015-03-20 LAB — HEPATIC FUNCTION PANEL
ALT: 26 U/L (ref 0–53)
AST: 31 U/L (ref 0–37)
Albumin: 4.5 g/dL (ref 3.5–5.2)
Alkaline Phosphatase: 64 U/L (ref 39–117)
BILIRUBIN DIRECT: 0.1 mg/dL (ref 0.0–0.3)
BILIRUBIN TOTAL: 0.6 mg/dL (ref 0.2–1.2)
TOTAL PROTEIN: 7.1 g/dL (ref 6.0–8.3)

## 2015-03-20 LAB — LIPID PANEL
CHOL/HDL RATIO: 3
Cholesterol: 185 mg/dL (ref 0–200)
HDL: 63.8 mg/dL (ref >39.00)
LDL Cholesterol: 99 mg/dL (ref 0–99)
NONHDL: 121.17
Triglycerides: 113 mg/dL (ref 0.0–149.0)
VLDL: 22.6 mg/dL (ref 0.0–40.0)

## 2015-03-20 LAB — PSA: PSA: 1.21 ng/mL (ref 0.10–4.00)

## 2015-03-20 LAB — TSH: TSH: 1.97 u[IU]/mL (ref 0.35–4.50)

## 2015-03-27 ENCOUNTER — Encounter: Payer: Self-pay | Admitting: Family Medicine

## 2015-03-27 ENCOUNTER — Ambulatory Visit (INDEPENDENT_AMBULATORY_CARE_PROVIDER_SITE_OTHER): Payer: BLUE CROSS/BLUE SHIELD | Admitting: Family Medicine

## 2015-03-27 VITALS — BP 130/70 | HR 80 | Temp 98.1°F | Ht 69.0 in | Wt 198.0 lb

## 2015-03-27 DIAGNOSIS — Z0000 Encounter for general adult medical examination without abnormal findings: Secondary | ICD-10-CM | POA: Diagnosis not present

## 2015-03-27 MED ORDER — OLMESARTAN MEDOXOMIL 20 MG PO TABS: 20.0000 mg | ORAL_TABLET | Freq: Every day | ORAL | Status: DC
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
## 2015-03-27 NOTE — Progress Notes (Signed)
Pre visit review using our clinic review tool, if applicable. No additional management support is needed unless otherwise documented below in the visit note. 

## 2015-03-27 NOTE — Patient Instructions (Signed)
We will call you regarding colonoscopy. 

## 2015-11-08 ENCOUNTER — Ambulatory Visit: Payer: 59 | Admitting: Family Medicine

## 2016-02-01 ENCOUNTER — Ambulatory Visit (INDEPENDENT_AMBULATORY_CARE_PROVIDER_SITE_OTHER): Payer: 59 | Admitting: Internal Medicine

## 2016-02-01 DIAGNOSIS — Z9189 Other specified personal risk factors, not elsewhere classified: Secondary | ICD-10-CM

## 2016-02-01 DIAGNOSIS — Z789 Other specified health status: Secondary | ICD-10-CM

## 2016-02-01 DIAGNOSIS — Z7189 Other specified counseling: Secondary | ICD-10-CM

## 2016-02-01 DIAGNOSIS — Z23 Encounter for immunization: Secondary | ICD-10-CM | POA: Diagnosis not present

## 2016-02-01 DIAGNOSIS — Z7184 Encounter for health counseling related to travel: Secondary | ICD-10-CM | POA: Insufficient documentation

## 2016-02-01 MED ORDER — ATOVAQUONE-PROGUANIL HCL 250-100 MG PO TABS: 1.0000 | ORAL_TABLET | Freq: Every day | ORAL | Status: DC

## 2016-02-01 MED ORDER — AZITHROMYCIN 500 MG PO TABS: 1000.0000 mg | ORAL_TABLET | Freq: Once | ORAL | Status: DC

## 2016-02-01 NOTE — Progress Notes (Signed)
Subjective:   Jose Kerr is a 50 y.o. male who presents to the Infectious Disease clinic for travel consultation. Countries of travel: [place] and [place] and [place] Areas in country: urban   Accommodations: hotel Purpose of travel: vacation Prior travel out of [place]: yes     Objective:   Medications: reviewed    Assessment:    No contraindications to travel. none     Plan:    Issues discussed: altitude illness, future shots, insect-borne illnesses, Japanese encephalitis, malaria, motion sickness, MVA safety, rabies, safe food/water, traveler's diarrhea, website/handouts for more information, what to do if ill upon return, what to do if ill while there and Yellow Fever. Immunizations recommended: Yellow Fever. Malaria prophylaxis: malarone, daily dose starting 1-2 days before entering endemic area, ending 7 days after leaving area; will call us if he needs more refills for these trips Traveler's diarrhea prophylaxis: azithromycin. Total duration of visit: 1 Hour. Total time spent on education, counseling, coordination of care: 30 Minutes.

## 2016-07-02 ENCOUNTER — Other Ambulatory Visit: Payer: Self-pay | Admitting: Family Medicine

## 2016-07-21 DIAGNOSIS — Z0000 Encounter for general adult medical examination without abnormal findings: Secondary | ICD-10-CM | POA: Diagnosis not present

## 2016-07-31 DIAGNOSIS — M7542 Impingement syndrome of left shoulder: Secondary | ICD-10-CM | POA: Diagnosis not present

## 2016-07-31 DIAGNOSIS — M5127 Other intervertebral disc displacement, lumbosacral region: Secondary | ICD-10-CM | POA: Diagnosis not present

## 2016-07-31 DIAGNOSIS — M7522 Bicipital tendinitis, left shoulder: Secondary | ICD-10-CM | POA: Diagnosis not present

## 2016-08-14 DIAGNOSIS — M7522 Bicipital tendinitis, left shoulder: Secondary | ICD-10-CM | POA: Diagnosis not present

## 2016-08-14 DIAGNOSIS — M5127 Other intervertebral disc displacement, lumbosacral region: Secondary | ICD-10-CM | POA: Diagnosis not present

## 2016-08-14 DIAGNOSIS — M7542 Impingement syndrome of left shoulder: Secondary | ICD-10-CM | POA: Diagnosis not present

## 2016-09-11 DIAGNOSIS — M7542 Impingement syndrome of left shoulder: Secondary | ICD-10-CM | POA: Diagnosis not present

## 2016-09-11 DIAGNOSIS — M7522 Bicipital tendinitis, left shoulder: Secondary | ICD-10-CM | POA: Diagnosis not present

## 2016-09-11 DIAGNOSIS — M5127 Other intervertebral disc displacement, lumbosacral region: Secondary | ICD-10-CM | POA: Diagnosis not present

## 2016-10-02 DIAGNOSIS — M5127 Other intervertebral disc displacement, lumbosacral region: Secondary | ICD-10-CM | POA: Diagnosis not present

## 2016-10-02 DIAGNOSIS — M7542 Impingement syndrome of left shoulder: Secondary | ICD-10-CM | POA: Diagnosis not present

## 2016-10-02 DIAGNOSIS — M7522 Bicipital tendinitis, left shoulder: Secondary | ICD-10-CM | POA: Diagnosis not present

## 2016-10-23 DIAGNOSIS — M7542 Impingement syndrome of left shoulder: Secondary | ICD-10-CM | POA: Diagnosis not present

## 2016-10-23 DIAGNOSIS — M5127 Other intervertebral disc displacement, lumbosacral region: Secondary | ICD-10-CM | POA: Diagnosis not present

## 2016-10-23 DIAGNOSIS — R201 Hypoesthesia of skin: Secondary | ICD-10-CM | POA: Diagnosis not present

## 2016-11-13 DIAGNOSIS — M5127 Other intervertebral disc displacement, lumbosacral region: Secondary | ICD-10-CM | POA: Diagnosis not present

## 2016-11-13 DIAGNOSIS — M7542 Impingement syndrome of left shoulder: Secondary | ICD-10-CM | POA: Diagnosis not present

## 2016-11-13 DIAGNOSIS — R201 Hypoesthesia of skin: Secondary | ICD-10-CM | POA: Diagnosis not present

## 2016-12-31 ENCOUNTER — Encounter: Payer: Self-pay | Admitting: Family Medicine

## 2016-12-31 ENCOUNTER — Ambulatory Visit (INDEPENDENT_AMBULATORY_CARE_PROVIDER_SITE_OTHER): Payer: 59 | Admitting: Family Medicine

## 2016-12-31 VITALS — BP 122/80 | HR 64 | Temp 98.3°F | Ht 69.5 in | Wt 209.6 lb

## 2016-12-31 DIAGNOSIS — Z0000 Encounter for general adult medical examination without abnormal findings: Secondary | ICD-10-CM | POA: Diagnosis not present

## 2016-12-31 LAB — CBC WITH DIFFERENTIAL/PLATELET
BASOS ABS: 0 10*3/uL (ref 0.0–0.1)
Basophils Relative: 0.9 % (ref 0.0–3.0)
EOS ABS: 0.2 10*3/uL (ref 0.0–0.7)
Eosinophils Relative: 5.4 % — ABNORMAL HIGH (ref 0.0–5.0)
HEMATOCRIT: 41.2 % (ref 39.0–52.0)
HEMOGLOBIN: 14 g/dL (ref 13.0–17.0)
Lymphocytes Relative: 37.5 % (ref 12.0–46.0)
Lymphs Abs: 1.7 10*3/uL (ref 0.7–4.0)
MCHC: 34 g/dL (ref 30.0–36.0)
MCV: 93.5 fl (ref 78.0–100.0)
MONOS PCT: 10.1 % (ref 3.0–12.0)
Monocytes Absolute: 0.5 10*3/uL (ref 0.1–1.0)
Neutro Abs: 2.1 10*3/uL (ref 1.4–7.7)
Neutrophils Relative %: 46.1 % (ref 43.0–77.0)
PLATELETS: 234 10*3/uL (ref 150.0–400.0)
RBC: 4.4 Mil/uL (ref 4.22–5.81)
RDW: 12.8 % (ref 11.5–15.5)
WBC: 4.6 10*3/uL (ref 4.0–10.5)

## 2016-12-31 LAB — BASIC METABOLIC PANEL
BUN: 17 mg/dL (ref 6–23)
CO2: 29 mEq/L (ref 19–32)
Calcium: 9.6 mg/dL (ref 8.4–10.5)
Chloride: 102 mEq/L (ref 96–112)
Creatinine, Ser: 1.15 mg/dL (ref 0.40–1.50)
GFR: 71.02 mL/min (ref >60.00)
Glucose, Bld: 100 mg/dL — ABNORMAL HIGH (ref 70–99)
POTASSIUM: 4.6 meq/L (ref 3.5–5.1)
SODIUM: 137 meq/L (ref 135–145)

## 2016-12-31 LAB — LIPID PANEL
CHOLESTEROL: 181 mg/dL (ref 0–200)
HDL: 58.6 mg/dL (ref >39.00)
LDL Cholesterol: 100 mg/dL — ABNORMAL HIGH (ref 0–99)
NonHDL: 122.11
Total CHOL/HDL Ratio: 3
Triglycerides: 111 mg/dL (ref 0.0–149.0)
VLDL: 22.2 mg/dL (ref 0.0–40.0)

## 2016-12-31 LAB — HEPATIC FUNCTION PANEL
ALK PHOS: 52 U/L (ref 39–117)
ALT: 24 U/L (ref 0–53)
AST: 27 U/L (ref 0–37)
Albumin: 4.7 g/dL (ref 3.5–5.2)
BILIRUBIN DIRECT: 0.1 mg/dL (ref 0.0–0.3)
BILIRUBIN TOTAL: 0.8 mg/dL (ref 0.2–1.2)
Total Protein: 7.1 g/dL (ref 6.0–8.3)

## 2016-12-31 LAB — TESTOSTERONE: Testosterone: 276.39 ng/dL — ABNORMAL LOW (ref 300.00–890.00)

## 2016-12-31 LAB — TSH: TSH: 1.41 u[IU]/mL (ref 0.35–4.50)
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
## 2016-12-31 NOTE — Patient Instructions (Signed)
Check on coverage for Shingrix vaccine- if interested.  We will call you with labs done today.

## 2017-01-01 DIAGNOSIS — M7542 Impingement syndrome of left shoulder: Secondary | ICD-10-CM | POA: Diagnosis not present

## 2017-01-01 DIAGNOSIS — R201 Hypoesthesia of skin: Secondary | ICD-10-CM | POA: Diagnosis not present

## 2017-01-01 DIAGNOSIS — M5127 Other intervertebral disc displacement, lumbosacral region: Secondary | ICD-10-CM | POA: Diagnosis not present

## 2017-01-06 ENCOUNTER — Encounter: Payer: Self-pay | Admitting: Internal Medicine

## 2017-01-06 ENCOUNTER — Encounter: Payer: Self-pay | Admitting: Family Medicine

## 2017-01-07 ENCOUNTER — Encounter: Payer: Self-pay | Admitting: Family Medicine

## 2017-01-09 ENCOUNTER — Ambulatory Visit (INDEPENDENT_AMBULATORY_CARE_PROVIDER_SITE_OTHER): Payer: 59 | Admitting: Family Medicine

## 2017-01-09 DIAGNOSIS — Z0000 Encounter for general adult medical examination without abnormal findings: Secondary | ICD-10-CM

## 2017-01-09 DIAGNOSIS — Z23 Encounter for immunization: Secondary | ICD-10-CM

## 2017-01-15 DIAGNOSIS — R201 Hypoesthesia of skin: Secondary | ICD-10-CM | POA: Diagnosis not present

## 2017-01-15 DIAGNOSIS — M7542 Impingement syndrome of left shoulder: Secondary | ICD-10-CM | POA: Diagnosis not present

## 2017-01-15 DIAGNOSIS — M5127 Other intervertebral disc displacement, lumbosacral region: Secondary | ICD-10-CM | POA: Diagnosis not present

## 2017-01-30 ENCOUNTER — Ambulatory Visit (INDEPENDENT_AMBULATORY_CARE_PROVIDER_SITE_OTHER): Payer: 59 | Admitting: Internal Medicine

## 2017-01-30 DIAGNOSIS — Z9189 Other specified personal risk factors, not elsewhere classified: Secondary | ICD-10-CM

## 2017-01-30 DIAGNOSIS — M7542 Impingement syndrome of left shoulder: Secondary | ICD-10-CM | POA: Diagnosis not present

## 2017-01-30 DIAGNOSIS — Z789 Other specified health status: Secondary | ICD-10-CM

## 2017-01-30 DIAGNOSIS — M5127 Other intervertebral disc displacement, lumbosacral region: Secondary | ICD-10-CM | POA: Diagnosis not present

## 2017-01-30 DIAGNOSIS — Z7189 Other specified counseling: Secondary | ICD-10-CM

## 2017-01-30 DIAGNOSIS — R201 Hypoesthesia of skin: Secondary | ICD-10-CM | POA: Diagnosis not present

## 2017-01-30 DIAGNOSIS — Z7184 Encounter for health counseling related to travel: Secondary | ICD-10-CM

## 2017-01-30 MED ORDER — CIPROFLOXACIN HCL 500 MG PO TABS: 500.0000 mg | ORAL_TABLET | Freq: Two times a day (BID) | ORAL | 0 refills | Status: DC

## 2017-01-30 MED ORDER — ACETAZOLAMIDE 125 MG PO TABS: 125.0000 mg | ORAL_TABLET | Freq: Two times a day (BID) | ORAL | 0 refills | Status: DC

## 2017-01-30 MED ORDER — DEXAMETHASONE 4 MG PO TABS: 4.0000 mg | ORAL_TABLET | Freq: Four times a day (QID) | ORAL | 0 refills | Status: DC

## 2017-01-30 MED ORDER — ATOVAQUONE-PROGUANIL HCL 250-100 MG PO TABS: 1.0000 | ORAL_TABLET | Freq: Every day | ORAL | 0 refills | Status: DC

## 2017-01-30 NOTE — Progress Notes (Signed)
Subjective:   EMMONS TOTH is a 51 y.o. male who presents to the Infectious Disease clinic for travel consultation. Planned departure date: September 2018          Planned return date: 2 weeks Countries of travel: [place] Areas in country: [place]   Accommodations: hiking Purpose of travel: vacation Prior travel out of [place]: yes     Objective:   Medications: revieiwed    Assessment:   No contraindications to travel. none     Plan:    Issues discussed: altitude illness, future shots, MVA safety, rabies, safe food/water, traveler's diarrhea, website/handouts for more information, what to do if ill upon return and what to do if ill while there. Immunizations recommended: none indicated.  I provided diamox and dexamethasone for abortive treatment of altitude illness Malaria prophylaxis: malarone, daily dose starting 1-2 days before entering endemic area, ending 7 days after leaving area Traveler's diarrhea prophylaxis: ciprofloxacin. Total duration of visit: 1 Hour. Total time spent on education, counseling, coordination of care: 45 Minutes.

## 2017-02-02 ENCOUNTER — Encounter: Payer: Self-pay | Admitting: Internal Medicine

## 2017-02-13 DIAGNOSIS — R201 Hypoesthesia of skin: Secondary | ICD-10-CM | POA: Diagnosis not present

## 2017-02-13 DIAGNOSIS — M5127 Other intervertebral disc displacement, lumbosacral region: Secondary | ICD-10-CM | POA: Diagnosis not present

## 2017-02-13 DIAGNOSIS — M7542 Impingement syndrome of left shoulder: Secondary | ICD-10-CM | POA: Diagnosis not present

## 2017-02-27 DIAGNOSIS — M5127 Other intervertebral disc displacement, lumbosacral region: Secondary | ICD-10-CM | POA: Diagnosis not present

## 2017-02-27 DIAGNOSIS — R201 Hypoesthesia of skin: Secondary | ICD-10-CM | POA: Diagnosis not present

## 2017-02-27 DIAGNOSIS — M7542 Impingement syndrome of left shoulder: Secondary | ICD-10-CM | POA: Diagnosis not present

## 2017-03-17 DIAGNOSIS — R201 Hypoesthesia of skin: Secondary | ICD-10-CM | POA: Diagnosis not present

## 2017-03-17 DIAGNOSIS — M7542 Impingement syndrome of left shoulder: Secondary | ICD-10-CM | POA: Diagnosis not present

## 2017-03-17 DIAGNOSIS — M5127 Other intervertebral disc displacement, lumbosacral region: Secondary | ICD-10-CM | POA: Diagnosis not present

## 2017-04-09 ENCOUNTER — Encounter: Payer: Self-pay | Admitting: Family Medicine

## 2017-04-09 DIAGNOSIS — R201 Hypoesthesia of skin: Secondary | ICD-10-CM | POA: Diagnosis not present

## 2017-04-09 DIAGNOSIS — M7542 Impingement syndrome of left shoulder: Secondary | ICD-10-CM | POA: Diagnosis not present

## 2017-04-09 DIAGNOSIS — M5127 Other intervertebral disc displacement, lumbosacral region: Secondary | ICD-10-CM | POA: Diagnosis not present

## 2017-04-12 ENCOUNTER — Encounter: Payer: Self-pay | Admitting: Family Medicine

## 2017-04-15 ENCOUNTER — Other Ambulatory Visit: Payer: Self-pay | Admitting: Family Medicine

## 2017-04-17 ENCOUNTER — Ambulatory Visit (INDEPENDENT_AMBULATORY_CARE_PROVIDER_SITE_OTHER): Payer: 59

## 2017-04-17 DIAGNOSIS — Z0000 Encounter for general adult medical examination without abnormal findings: Secondary | ICD-10-CM | POA: Diagnosis not present

## 2017-05-01 DIAGNOSIS — R201 Hypoesthesia of skin: Secondary | ICD-10-CM | POA: Diagnosis not present

## 2017-05-01 DIAGNOSIS — M5127 Other intervertebral disc displacement, lumbosacral region: Secondary | ICD-10-CM | POA: Diagnosis not present

## 2017-05-01 DIAGNOSIS — M7542 Impingement syndrome of left shoulder: Secondary | ICD-10-CM | POA: Diagnosis not present

## 2017-05-21 DIAGNOSIS — M4306 Spondylolysis, lumbar region: Secondary | ICD-10-CM | POA: Diagnosis not present

## 2017-05-21 DIAGNOSIS — M5127 Other intervertebral disc displacement, lumbosacral region: Secondary | ICD-10-CM | POA: Diagnosis not present

## 2017-05-21 DIAGNOSIS — M7542 Impingement syndrome of left shoulder: Secondary | ICD-10-CM | POA: Diagnosis not present

## 2017-06-10 DIAGNOSIS — M7542 Impingement syndrome of left shoulder: Secondary | ICD-10-CM | POA: Diagnosis not present

## 2017-06-10 DIAGNOSIS — M5127 Other intervertebral disc displacement, lumbosacral region: Secondary | ICD-10-CM | POA: Diagnosis not present

## 2017-06-10 DIAGNOSIS — M4306 Spondylolysis, lumbar region: Secondary | ICD-10-CM | POA: Diagnosis not present

## 2017-06-16 ENCOUNTER — Encounter: Payer: Self-pay | Admitting: Family Medicine

## 2017-06-17 ENCOUNTER — Encounter: Payer: Self-pay | Admitting: Family Medicine

## 2017-06-17 ENCOUNTER — Ambulatory Visit (INDEPENDENT_AMBULATORY_CARE_PROVIDER_SITE_OTHER): Payer: 59 | Admitting: Family Medicine

## 2017-06-17 VITALS — BP 130/70 | HR 70 | Temp 98.2°F | Wt 211.1 lb

## 2017-06-17 DIAGNOSIS — I10 Essential (primary) hypertension: Secondary | ICD-10-CM | POA: Diagnosis not present
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
## 2017-06-17 NOTE — Patient Instructions (Signed)
Monitor blood pressure and be in touch if consistently > 130/80 Try to lose a few pounds  Keep exercising!

## 2017-07-02 DIAGNOSIS — M7542 Impingement syndrome of left shoulder: Secondary | ICD-10-CM | POA: Diagnosis not present

## 2017-07-02 DIAGNOSIS — M4306 Spondylolysis, lumbar region: Secondary | ICD-10-CM | POA: Diagnosis not present

## 2017-07-02 DIAGNOSIS — M5127 Other intervertebral disc displacement, lumbosacral region: Secondary | ICD-10-CM | POA: Diagnosis not present

## 2017-07-21 DIAGNOSIS — Z0000 Encounter for general adult medical examination without abnormal findings: Secondary | ICD-10-CM | POA: Diagnosis not present

## 2017-07-23 DIAGNOSIS — M5127 Other intervertebral disc displacement, lumbosacral region: Secondary | ICD-10-CM | POA: Diagnosis not present

## 2017-07-23 DIAGNOSIS — M7542 Impingement syndrome of left shoulder: Secondary | ICD-10-CM | POA: Diagnosis not present

## 2017-07-23 DIAGNOSIS — M4306 Spondylolysis, lumbar region: Secondary | ICD-10-CM | POA: Diagnosis not present

## 2017-08-13 DIAGNOSIS — M5127 Other intervertebral disc displacement, lumbosacral region: Secondary | ICD-10-CM | POA: Diagnosis not present

## 2017-08-13 DIAGNOSIS — M4306 Spondylolysis, lumbar region: Secondary | ICD-10-CM | POA: Diagnosis not present

## 2017-08-13 DIAGNOSIS — M7542 Impingement syndrome of left shoulder: Secondary | ICD-10-CM | POA: Diagnosis not present

## 2017-09-02 ENCOUNTER — Encounter: Payer: Self-pay | Admitting: Family Medicine

## 2017-09-02 MED ORDER — OLMESARTAN MEDOXOMIL 20 MG PO TABS: 20.0000 mg | ORAL_TABLET | Freq: Every day | ORAL | 3 refills | Status: DC

## 2017-09-03 DIAGNOSIS — M4306 Spondylolysis, lumbar region: Secondary | ICD-10-CM | POA: Diagnosis not present

## 2017-09-03 DIAGNOSIS — M5127 Other intervertebral disc displacement, lumbosacral region: Secondary | ICD-10-CM | POA: Diagnosis not present

## 2017-09-03 DIAGNOSIS — M7542 Impingement syndrome of left shoulder: Secondary | ICD-10-CM | POA: Diagnosis not present

## 2017-09-24 ENCOUNTER — Encounter: Payer: Self-pay | Admitting: Family Medicine

## 2017-09-24 DIAGNOSIS — M7542 Impingement syndrome of left shoulder: Secondary | ICD-10-CM | POA: Diagnosis not present

## 2017-09-24 DIAGNOSIS — M4306 Spondylolysis, lumbar region: Secondary | ICD-10-CM | POA: Diagnosis not present

## 2017-09-24 DIAGNOSIS — M5127 Other intervertebral disc displacement, lumbosacral region: Secondary | ICD-10-CM | POA: Diagnosis not present

## 2017-09-24 MED ORDER — ATOVAQUONE-PROGUANIL HCL 250-100 MG PO TABS: ORAL_TABLET | ORAL | 0 refills | Status: DC

## 2017-09-24 NOTE — Telephone Encounter (Signed)
Patient had typhoid vaccine in 2016 and it is good for 5 years. MyChart message sent to patient notifying him of this and that Dr. Elease Hashimoto is out of the office today.  Please advise request for Atovaquone/Proguanil.

## 2017-10-16 DIAGNOSIS — M4306 Spondylolysis, lumbar region: Secondary | ICD-10-CM | POA: Diagnosis not present

## 2017-10-16 DIAGNOSIS — M5127 Other intervertebral disc displacement, lumbosacral region: Secondary | ICD-10-CM | POA: Diagnosis not present

## 2017-10-16 DIAGNOSIS — M7542 Impingement syndrome of left shoulder: Secondary | ICD-10-CM | POA: Diagnosis not present

## 2017-10-28 ENCOUNTER — Encounter: Payer: Self-pay | Admitting: Family Medicine

## 2017-10-28 DIAGNOSIS — Z789 Other specified health status: Secondary | ICD-10-CM

## 2017-11-05 ENCOUNTER — Other Ambulatory Visit (INDEPENDENT_AMBULATORY_CARE_PROVIDER_SITE_OTHER): Payer: 59

## 2017-11-05 DIAGNOSIS — M7542 Impingement syndrome of left shoulder: Secondary | ICD-10-CM | POA: Diagnosis not present

## 2017-11-05 DIAGNOSIS — Z789 Other specified health status: Secondary | ICD-10-CM | POA: Diagnosis not present

## 2017-11-05 DIAGNOSIS — M5127 Other intervertebral disc displacement, lumbosacral region: Secondary | ICD-10-CM | POA: Diagnosis not present

## 2017-11-05 DIAGNOSIS — M4306 Spondylolysis, lumbar region: Secondary | ICD-10-CM | POA: Diagnosis not present

## 2017-11-06 LAB — MEASLES/MUMPS/RUBELLA IMMUNITY
MUMPS IGG: 48.9 [AU]/ml
Rubella: 1.74 index
Rubeola IgG: <25 AU/mL — ABNORMAL LOW

## 2017-11-09 ENCOUNTER — Encounter: Payer: Self-pay | Admitting: Family Medicine

## 2017-11-11 NOTE — Telephone Encounter (Signed)
Patent was notified of lab results and has upcoming appointment to receive vaccine. No further action needed at this time.

## 2017-11-16 ENCOUNTER — Ambulatory Visit (INDEPENDENT_AMBULATORY_CARE_PROVIDER_SITE_OTHER): Payer: 59 | Admitting: Family Medicine

## 2017-11-16 ENCOUNTER — Ambulatory Visit: Payer: 59

## 2017-11-16 ENCOUNTER — Encounter: Payer: Self-pay | Admitting: Family Medicine

## 2017-11-16 VITALS — BP 120/70 | HR 75 | Temp 98.0°F | Wt 216.5 lb

## 2017-11-16 DIAGNOSIS — H9313 Tinnitus, bilateral: Secondary | ICD-10-CM

## 2017-11-16 DIAGNOSIS — Z23 Encounter for immunization: Secondary | ICD-10-CM | POA: Diagnosis not present

## 2017-11-16 MED ORDER — OLMESARTAN MEDOXOMIL 20 MG PO TABS: 20.0000 mg | ORAL_TABLET | Freq: Every day | ORAL | 3 refills | Status: DC

## 2017-11-16 NOTE — Patient Instructions (Signed)
Tinnitus Tinnitus refers to hearing a sound when there is no actual source for that sound. This is often described as ringing in the ears. However, people with this condition may hear a variety of noises. A person may hear the sound in one ear or in both ears. The sounds of tinnitus can be soft, loud, or somewhere in between. Tinnitus can last for a few seconds or can be constant for days. It may go away without treatment and come back at various times. When tinnitus is constant or happens often, it can lead to other problems, such as trouble sleeping and trouble concentrating. Almost everyone experiences tinnitus at some point. Tinnitus that is long-lasting (chronic) or comes back often is a problem that may require medical attention. What are the causes? The cause of tinnitus is often not known. In some cases, it can result from other problems or conditions, including:  Exposure to loud noises from machinery, music, or other sources.  Hearing loss.  Ear or sinus infections.  Earwax buildup.  A foreign object in the ear.  Use of certain medicines.  Use of alcohol and caffeine.  High blood pressure.  Heart diseases.  Anemia.  Allergies.  Meniere disease.  Thyroid problems.  Tumors.  An enlarged part of a weakened blood vessel (aneurysm).  What are the signs or symptoms? The main symptom of tinnitus is hearing a sound when there is no source for that sound. It may sound like:  Buzzing.  Roaring.  Ringing.  Blowing air, similar to the sound heard when you listen to a seashell.  Hissing.  Whistling.  Sizzling.  Humming.  Running water.  A sustained musical note.  How is this diagnosed? Tinnitus is diagnosed based on your symptoms. Your health care provider will do a physical exam. A comprehensive hearing exam (audiologic exam) will be done if your tinnitus:  Affects only one ear (unilateral).  Causes hearing difficulties.  Lasts 6 months or  longer.  You may also need to see a health care provider who specializes in hearing disorders (audiologist). You may be asked to complete a questionnaire to determine the severity of your tinnitus. Tests may be done to help determine the cause and to rule out other conditions. These can include:  Imaging studies of your head and brain, such as: ? A CT scan. ? An MRI.  An imaging study of your blood vessels (angiogram).  How is this treated? Treating an underlying medical condition can sometimes make tinnitus go away. If your tinnitus continues, other treatments may include:  Medicines, such as certain antidepressants or sleeping aids.  Sound generators to mask the tinnitus. These include: ? Tabletop sound machines that play relaxing sounds to help you fall asleep. ? Wearable devices that fit in your ear and play sounds or music. ? A small device that uses headphones to deliver a signal embedded in music (acoustic neural stimulation). In time, this may change the pathways of your brain and make you less sensitive to tinnitus. This device is used for very severe cases when no other treatment is working.  Therapy and counseling to help you manage the stress of living with tinnitus.  Using hearing aids or cochlear implants, if your tinnitus is related to hearing loss.  Follow these instructions at home:  When possible, avoid being in loud places and being exposed to loud sounds.  Wear hearing protection, such as earplugs, when you are exposed to loud noises.  Do not take stimulants, such as nicotine,   alcohol, or caffeine.  Practice techniques for reducing stress, such as meditation, yoga, or deep breathing.  Use a white noise machine, a humidifier, or other devices to mask the sound of tinnitus.  Sleep with your head slightly raised. This may reduce the impact of tinnitus.  Try to get plenty of rest each night. Contact a health care provider if:  You have tinnitus in just one  ear.  Your tinnitus continues for 3 weeks or longer without stopping.  Home care measures are not helping.  You have tinnitus after a head injury.  You have tinnitus along with any of the following: ? Dizziness. ? Loss of balance. ? Nausea and vomiting. This information is not intended to replace advice given to you by your health care provider. Make sure you discuss any questions you have with your health care provider. Document Released: 07/07/2005 Document Revised: 03/09/2016 Document Reviewed: 12/07/2013 Elsevier Interactive Patient Education  2018 Elsevier Inc.  
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
## 2017-11-17 ENCOUNTER — Encounter: Payer: Self-pay | Admitting: Family Medicine

## 2017-11-17 MED ORDER — OLMESARTAN MEDOXOMIL 20 MG PO TABS: 20.0000 mg | ORAL_TABLET | Freq: Every day | ORAL | 0 refills | Status: DC

## 2017-11-18 ENCOUNTER — Encounter: Payer: Self-pay | Admitting: Family Medicine

## 2017-11-26 DIAGNOSIS — M4306 Spondylolysis, lumbar region: Secondary | ICD-10-CM | POA: Diagnosis not present

## 2017-11-26 DIAGNOSIS — M5127 Other intervertebral disc displacement, lumbosacral region: Secondary | ICD-10-CM | POA: Diagnosis not present

## 2017-11-26 DIAGNOSIS — M7542 Impingement syndrome of left shoulder: Secondary | ICD-10-CM | POA: Diagnosis not present

## 2017-12-16 DIAGNOSIS — D2271 Melanocytic nevi of right lower limb, including hip: Secondary | ICD-10-CM | POA: Diagnosis not present

## 2017-12-16 DIAGNOSIS — M7542 Impingement syndrome of left shoulder: Secondary | ICD-10-CM | POA: Diagnosis not present

## 2017-12-16 DIAGNOSIS — M5127 Other intervertebral disc displacement, lumbosacral region: Secondary | ICD-10-CM | POA: Diagnosis not present

## 2017-12-16 DIAGNOSIS — M4306 Spondylolysis, lumbar region: Secondary | ICD-10-CM | POA: Diagnosis not present

## 2017-12-17 ENCOUNTER — Ambulatory Visit (INDEPENDENT_AMBULATORY_CARE_PROVIDER_SITE_OTHER): Payer: 59 | Admitting: Family Medicine

## 2017-12-17 DIAGNOSIS — Z23 Encounter for immunization: Secondary | ICD-10-CM

## 2018-01-07 DIAGNOSIS — M7542 Impingement syndrome of left shoulder: Secondary | ICD-10-CM | POA: Diagnosis not present

## 2018-01-07 DIAGNOSIS — M5127 Other intervertebral disc displacement, lumbosacral region: Secondary | ICD-10-CM | POA: Diagnosis not present

## 2018-01-07 DIAGNOSIS — M4306 Spondylolysis, lumbar region: Secondary | ICD-10-CM | POA: Diagnosis not present

## 2018-01-26 ENCOUNTER — Encounter: Payer: Self-pay | Admitting: Family Medicine

## 2018-01-29 DIAGNOSIS — M7542 Impingement syndrome of left shoulder: Secondary | ICD-10-CM | POA: Diagnosis not present

## 2018-01-29 DIAGNOSIS — M4306 Spondylolysis, lumbar region: Secondary | ICD-10-CM | POA: Diagnosis not present

## 2018-01-29 DIAGNOSIS — M5127 Other intervertebral disc displacement, lumbosacral region: Secondary | ICD-10-CM | POA: Diagnosis not present

## 2018-02-10 ENCOUNTER — Ambulatory Visit (INDEPENDENT_AMBULATORY_CARE_PROVIDER_SITE_OTHER): Payer: 59 | Admitting: Family Medicine

## 2018-02-10 ENCOUNTER — Encounter: Payer: Self-pay | Admitting: Family Medicine

## 2018-02-10 VITALS — BP 110/64 | HR 77 | Temp 98.4°F | Ht 69.0 in | Wt 216.0 lb

## 2018-02-10 DIAGNOSIS — Z0000 Encounter for general adult medical examination without abnormal findings: Secondary | ICD-10-CM

## 2018-02-10 LAB — CBC WITH DIFFERENTIAL/PLATELET
Basophils Relative: 1 % (ref 0.0–3.0)
EOS PCT: 3.3 % (ref 0.0–5.0)
HCT: 44.7 % (ref 39.0–52.0)
HEMOGLOBIN: 15.1 g/dL (ref 13.0–17.0)
Lymphocytes Relative: 37.7 % (ref 12.0–46.0)
MCHC: 33.9 g/dL (ref 30.0–36.0)
MCV: 94.4 fl (ref 78.0–100.0)
MONOS PCT: 10.1 % (ref 3.0–12.0)
Neutrophils Relative %: 47.9 % (ref 43.0–77.0)
Platelets: 297 10*3/uL (ref 150.0–400.0)
RBC: 4.74 Mil/uL (ref 4.22–5.81)
RDW: 12.7 % (ref 11.5–15.5)
WBC: 4.5 10*3/uL (ref 4.0–10.5)

## 2018-02-10 LAB — BASIC METABOLIC PANEL
BUN: 14 mg/dL (ref 6–23)
CALCIUM: 9.9 mg/dL (ref 8.4–10.5)
CO2: 28 mEq/L (ref 19–32)
Chloride: 100 mEq/L (ref 96–112)
Creatinine, Ser: 1.12 mg/dL (ref 0.40–1.50)
GFR: 72.91 mL/min (ref >60.00)
Glucose, Bld: 101 mg/dL — ABNORMAL HIGH (ref 70–99)
Potassium: 4.6 mEq/L (ref 3.5–5.1)
Sodium: 137 mEq/L (ref 135–145)

## 2018-02-10 LAB — LIPID PANEL
CHOLESTEROL: 240 mg/dL — AB (ref 0–200)
HDL: 57 mg/dL (ref >39.00)
NONHDL: 182.6
TRIGLYCERIDES: 222 mg/dL — AB (ref 0.0–149.0)
Total CHOL/HDL Ratio: 4
VLDL: 44.4 mg/dL — ABNORMAL HIGH (ref 0.0–40.0)

## 2018-02-10 LAB — HEPATIC FUNCTION PANEL
ALT: 52 U/L (ref 0–53)
AST: 36 U/L (ref 0–37)
Albumin: 5 g/dL (ref 3.5–5.2)
Alkaline Phosphatase: 55 U/L (ref 39–117)
BILIRUBIN TOTAL: 0.8 mg/dL (ref 0.2–1.2)
Bilirubin, Direct: 0.1 mg/dL (ref 0.0–0.3)
Total Protein: 8 g/dL (ref 6.0–8.3)

## 2018-02-10 LAB — PSA: PSA: 1.33 ng/mL (ref 0.10–4.00)

## 2018-02-10 LAB — TSH: TSH: 1.48 u[IU]/mL (ref 0.35–4.50)

## 2018-02-10 LAB — LDL CHOLESTEROL, DIRECT: LDL DIRECT: 154 mg/dL

## 2018-02-10 MED ORDER — OLMESARTAN MEDOXOMIL 20 MG PO TABS: 20.0000 mg | ORAL_TABLET | Freq: Every day | ORAL | 11 refills | Status: DC
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
## 2018-02-11 ENCOUNTER — Encounter: Payer: Self-pay | Admitting: Family Medicine

## 2018-02-18 DIAGNOSIS — M4306 Spondylolysis, lumbar region: Secondary | ICD-10-CM | POA: Diagnosis not present

## 2018-02-18 DIAGNOSIS — M5127 Other intervertebral disc displacement, lumbosacral region: Secondary | ICD-10-CM | POA: Diagnosis not present

## 2018-02-18 DIAGNOSIS — M7542 Impingement syndrome of left shoulder: Secondary | ICD-10-CM | POA: Diagnosis not present

## 2018-03-11 DIAGNOSIS — M7542 Impingement syndrome of left shoulder: Secondary | ICD-10-CM | POA: Diagnosis not present

## 2018-03-11 DIAGNOSIS — M4306 Spondylolysis, lumbar region: Secondary | ICD-10-CM | POA: Diagnosis not present

## 2018-03-11 DIAGNOSIS — M5127 Other intervertebral disc displacement, lumbosacral region: Secondary | ICD-10-CM | POA: Diagnosis not present

## 2018-04-02 DIAGNOSIS — M4306 Spondylolysis, lumbar region: Secondary | ICD-10-CM | POA: Diagnosis not present

## 2018-04-02 DIAGNOSIS — M5127 Other intervertebral disc displacement, lumbosacral region: Secondary | ICD-10-CM | POA: Diagnosis not present

## 2018-04-02 DIAGNOSIS — M7542 Impingement syndrome of left shoulder: Secondary | ICD-10-CM | POA: Diagnosis not present

## 2018-04-22 DIAGNOSIS — M7542 Impingement syndrome of left shoulder: Secondary | ICD-10-CM | POA: Diagnosis not present

## 2018-04-22 DIAGNOSIS — M4306 Spondylolysis, lumbar region: Secondary | ICD-10-CM | POA: Diagnosis not present

## 2018-04-22 DIAGNOSIS — M5127 Other intervertebral disc displacement, lumbosacral region: Secondary | ICD-10-CM | POA: Diagnosis not present

## 2018-05-11 DIAGNOSIS — M4306 Spondylolysis, lumbar region: Secondary | ICD-10-CM | POA: Diagnosis not present

## 2018-05-11 DIAGNOSIS — M7542 Impingement syndrome of left shoulder: Secondary | ICD-10-CM | POA: Diagnosis not present

## 2018-05-11 DIAGNOSIS — M5127 Other intervertebral disc displacement, lumbosacral region: Secondary | ICD-10-CM | POA: Diagnosis not present

## 2018-05-24 ENCOUNTER — Encounter: Payer: Self-pay | Admitting: Family Medicine

## 2018-06-02 ENCOUNTER — Other Ambulatory Visit: Payer: Self-pay

## 2018-06-02 ENCOUNTER — Encounter: Payer: Self-pay | Admitting: Family Medicine

## 2018-06-02 ENCOUNTER — Ambulatory Visit (INDEPENDENT_AMBULATORY_CARE_PROVIDER_SITE_OTHER): Payer: 59 | Admitting: Family Medicine

## 2018-06-02 VITALS — BP 134/82 | HR 93 | Temp 98.2°F | Ht 69.0 in | Wt 217.0 lb

## 2018-06-02 DIAGNOSIS — R42 Dizziness and giddiness: Secondary | ICD-10-CM | POA: Diagnosis not present

## 2018-06-02 DIAGNOSIS — I10 Essential (primary) hypertension: Secondary | ICD-10-CM

## 2018-06-02 DIAGNOSIS — R6889 Other general symptoms and signs: Secondary | ICD-10-CM

## 2018-06-02 NOTE — Patient Instructions (Signed)
We will set up cardiology referral for further evaluation.
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
## 2018-06-03 DIAGNOSIS — M5127 Other intervertebral disc displacement, lumbosacral region: Secondary | ICD-10-CM | POA: Diagnosis not present

## 2018-06-03 DIAGNOSIS — M7542 Impingement syndrome of left shoulder: Secondary | ICD-10-CM | POA: Diagnosis not present

## 2018-06-03 DIAGNOSIS — M4306 Spondylolysis, lumbar region: Secondary | ICD-10-CM | POA: Diagnosis not present

## 2018-06-06 MED ORDER — ATOVAQUONE-PROGUANIL HCL 250-100 MG PO TABS: ORAL_TABLET | ORAL | 0 refills | Status: DC

## 2018-06-18 ENCOUNTER — Encounter: Payer: Self-pay | Admitting: Family Medicine

## 2018-06-21 DIAGNOSIS — M4306 Spondylolysis, lumbar region: Secondary | ICD-10-CM | POA: Diagnosis not present

## 2018-06-21 DIAGNOSIS — M7542 Impingement syndrome of left shoulder: Secondary | ICD-10-CM | POA: Diagnosis not present

## 2018-06-21 DIAGNOSIS — M5127 Other intervertebral disc displacement, lumbosacral region: Secondary | ICD-10-CM | POA: Diagnosis not present

## 2018-07-08 DIAGNOSIS — M5127 Other intervertebral disc displacement, lumbosacral region: Secondary | ICD-10-CM | POA: Diagnosis not present

## 2018-07-08 DIAGNOSIS — M4306 Spondylolysis, lumbar region: Secondary | ICD-10-CM | POA: Diagnosis not present

## 2018-07-08 DIAGNOSIS — M7542 Impingement syndrome of left shoulder: Secondary | ICD-10-CM | POA: Diagnosis not present

## 2018-07-17 ENCOUNTER — Encounter: Payer: Self-pay | Admitting: Family Medicine

## 2018-07-22 ENCOUNTER — Ambulatory Visit: Payer: 59 | Admitting: Cardiology

## 2018-07-29 DIAGNOSIS — M7542 Impingement syndrome of left shoulder: Secondary | ICD-10-CM | POA: Diagnosis not present

## 2018-07-29 DIAGNOSIS — M5127 Other intervertebral disc displacement, lumbosacral region: Secondary | ICD-10-CM | POA: Diagnosis not present

## 2018-07-29 DIAGNOSIS — M4306 Spondylolysis, lumbar region: Secondary | ICD-10-CM | POA: Diagnosis not present

## 2018-08-16 ENCOUNTER — Encounter: Payer: Self-pay | Admitting: Family Medicine

## 2018-08-19 DIAGNOSIS — M4306 Spondylolysis, lumbar region: Secondary | ICD-10-CM | POA: Diagnosis not present

## 2018-08-19 DIAGNOSIS — M5127 Other intervertebral disc displacement, lumbosacral region: Secondary | ICD-10-CM | POA: Diagnosis not present

## 2018-08-19 DIAGNOSIS — M7542 Impingement syndrome of left shoulder: Secondary | ICD-10-CM | POA: Diagnosis not present

## 2018-09-09 DIAGNOSIS — M7542 Impingement syndrome of left shoulder: Secondary | ICD-10-CM | POA: Diagnosis not present

## 2018-09-09 DIAGNOSIS — M4306 Spondylolysis, lumbar region: Secondary | ICD-10-CM | POA: Diagnosis not present

## 2018-09-09 DIAGNOSIS — M5127 Other intervertebral disc displacement, lumbosacral region: Secondary | ICD-10-CM | POA: Diagnosis not present

## 2018-09-10 ENCOUNTER — Encounter: Payer: Self-pay | Admitting: Family Medicine

## 2018-09-15 ENCOUNTER — Encounter: Payer: Self-pay | Admitting: Family Medicine

## 2018-09-15 ENCOUNTER — Ambulatory Visit (INDEPENDENT_AMBULATORY_CARE_PROVIDER_SITE_OTHER): Payer: 59 | Admitting: Family Medicine

## 2018-09-15 ENCOUNTER — Other Ambulatory Visit: Payer: Self-pay

## 2018-09-15 VITALS — BP 116/80 | HR 83 | Temp 98.2°F | Ht 69.0 in | Wt 220.1 lb

## 2018-09-15 DIAGNOSIS — J0100 Acute maxillary sinusitis, unspecified: Secondary | ICD-10-CM

## 2018-09-15 MED ORDER — DOXYCYCLINE HYCLATE 100 MG PO CAPS: 100.0000 mg | ORAL_CAPSULE | Freq: Two times a day (BID) | ORAL | 0 refills | Status: DC
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
## 2018-09-15 NOTE — Patient Instructions (Signed)
Sinusitis, Adult  Sinusitis is inflammation of your sinuses. Sinuses are hollow spaces in the bones around your face. Your sinuses are located:   Around your eyes.   In the middle of your forehead.   Behind your nose.   In your cheekbones.  Mucus normally drains out of your sinuses. When your nasal tissues become inflamed or swollen, mucus can become trapped or blocked. This allows bacteria, viruses, and fungi to grow, which leads to infection. Most infections of the sinuses are caused by a virus.  Sinusitis can develop quickly. It can last for up to 4 weeks (acute) or for more than 12 weeks (chronic). Sinusitis often develops after a cold.  What are the causes?  This condition is caused by anything that creates swelling in the sinuses or stops mucus from draining. This includes:   Allergies.   Asthma.   Infection from bacteria or viruses.   Deformities or blockages in your nose or sinuses.   Abnormal growths in the nose (nasal polyps).   Pollutants, such as chemicals or irritants in the air.   Infection from fungi (rare).  What increases the risk?  You are more likely to develop this condition if you:   Have a weak body defense system (immune system).   Do a lot of swimming or diving.   Overuse nasal sprays.   Smoke.  What are the signs or symptoms?  The main symptoms of this condition are pain and a feeling of pressure around the affected sinuses. Other symptoms include:   Stuffy nose or congestion.   Thick drainage from your nose.   Swelling and warmth over the affected sinuses.   Headache.   Upper toothache.   A cough that may get worse at night.   Extra mucus that collects in the throat or the back of the nose (postnasal drip).   Decreased sense of smell and taste.   Fatigue.   A fever.   Sore throat.   Bad breath.  How is this diagnosed?  This condition is diagnosed based on:   Your symptoms.   Your medical history.   A physical exam.   Tests to find out if your condition is  acute or chronic. This may include:  ? Checking your nose for nasal polyps.  ? Viewing your sinuses using a device that has a light (endoscope).  ? Testing for allergies or bacteria.  ? Imaging tests, such as an MRI or CT scan.  In rare cases, a bone biopsy may be done to rule out more serious types of fungal sinus disease.  How is this treated?  Treatment for sinusitis depends on the cause and whether your condition is chronic or acute.   If caused by a virus, your symptoms should go away on their own within 10 days. You may be given medicines to relieve symptoms. They include:  ? Medicines that shrink swollen nasal passages (topical intranasal decongestants).  ? Medicines that treat allergies (antihistamines).  ? A spray that eases inflammation of the nostrils (topical intranasal corticosteroids).  ? Rinses that help get rid of thick mucus in your nose (nasal saline washes).   If caused by bacteria, your health care provider may recommend waiting to see if your symptoms improve. Most bacterial infections will get better without antibiotic medicine. You may be given antibiotics if you have:  ? A severe infection.  ? A weak immune system.   If caused by narrow nasal passages or nasal polyps, you may need   to have surgery.  Follow these instructions at home:  Medicines   Take, use, or apply over-the-counter and prescription medicines only as told by your health care provider. These may include nasal sprays.   If you were prescribed an antibiotic medicine, take it as told by your health care provider. Do not stop taking the antibiotic even if you start to feel better.  Hydrate and humidify     Drink enough fluid to keep your urine pale yellow. Staying hydrated will help to thin your mucus.   Use a cool mist humidifier to keep the humidity level in your home above 50%.   Inhale steam for 10-15 minutes, 3-4 times a day, or as told by your health care provider. You can do this in the bathroom while a hot shower is  running.   Limit your exposure to cool or dry air.  Rest   Rest as much as possible.   Sleep with your head raised (elevated).   Make sure you get enough sleep each night.  General instructions     Apply a warm, moist washcloth to your face 3-4 times a day or as told by your health care provider. This will help with discomfort.   Wash your hands often with soap and water to reduce your exposure to germs. If soap and water are not available, use hand sanitizer.   Do not smoke. Avoid being around people who are smoking (secondhand smoke).   Keep all follow-up visits as told by your health care provider. This is important.  Contact a health care provider if:   You have a fever.   Your symptoms get worse.   Your symptoms do not improve within 10 days.  Get help right away if:   You have a severe headache.   You have persistent vomiting.   You have severe pain or swelling around your face or eyes.   You have vision problems.   You develop confusion.   Your neck is stiff.   You have trouble breathing.  Summary   Sinusitis is soreness and inflammation of your sinuses. Sinuses are hollow spaces in the bones around your face.   This condition is caused by nasal tissues that become inflamed or swollen. The swelling traps or blocks the flow of mucus. This allows bacteria, viruses, and fungi to grow, which leads to infection.   If you were prescribed an antibiotic medicine, take it as told by your health care provider. Do not stop taking the antibiotic even if you start to feel better.   Keep all follow-up visits as told by your health care provider. This is important.  This information is not intended to replace advice given to you by your health care provider. Make sure you discuss any questions you have with your health care provider.  Document Released: 07/07/2005 Document Revised: 12/07/2017 Document Reviewed: 12/07/2017  Elsevier Interactive Patient Education  2019 [employer]

## 2018-09-30 DIAGNOSIS — M4306 Spondylolysis, lumbar region: Secondary | ICD-10-CM | POA: Diagnosis not present

## 2018-09-30 DIAGNOSIS — M7542 Impingement syndrome of left shoulder: Secondary | ICD-10-CM | POA: Diagnosis not present

## 2018-09-30 DIAGNOSIS — M5127 Other intervertebral disc displacement, lumbosacral region: Secondary | ICD-10-CM | POA: Diagnosis not present

## 2018-10-06 ENCOUNTER — Encounter: Payer: Self-pay | Admitting: Family Medicine

## 2018-10-07 MED ORDER — AMOXICILLIN-POT CLAVULANATE 875-125 MG PO TABS: ORAL_TABLET | ORAL | 0 refills | Status: DC

## 2018-10-21 DIAGNOSIS — M5127 Other intervertebral disc displacement, lumbosacral region: Secondary | ICD-10-CM | POA: Diagnosis not present

## 2018-10-21 DIAGNOSIS — M7542 Impingement syndrome of left shoulder: Secondary | ICD-10-CM | POA: Diagnosis not present

## 2018-10-21 DIAGNOSIS — M4306 Spondylolysis, lumbar region: Secondary | ICD-10-CM | POA: Diagnosis not present

## 2018-11-22 ENCOUNTER — Encounter: Payer: Self-pay | Admitting: Family Medicine

## 2018-11-22 DIAGNOSIS — Z0184 Encounter for antibody response examination: Secondary | ICD-10-CM

## 2018-12-02 ENCOUNTER — Other Ambulatory Visit: Payer: Self-pay

## 2018-12-02 ENCOUNTER — Other Ambulatory Visit (INDEPENDENT_AMBULATORY_CARE_PROVIDER_SITE_OTHER): Payer: 59

## 2018-12-02 DIAGNOSIS — Z0184 Encounter for antibody response examination: Secondary | ICD-10-CM

## 2018-12-02 NOTE — Telephone Encounter (Signed)
Order placed

## 2018-12-03 LAB — SAR COV2 SEROLOGY (COVID19)AB(IGG),IA: SARS CoV2 AB IGG: NEGATIVE

## 2018-12-16 ENCOUNTER — Encounter: Payer: Self-pay | Admitting: Family Medicine

## 2019-01-23 ENCOUNTER — Other Ambulatory Visit: Payer: Self-pay | Admitting: Family Medicine

## 2019-01-23 DIAGNOSIS — Z0000 Encounter for general adult medical examination without abnormal findings: Secondary | ICD-10-CM

## 2019-02-22 ENCOUNTER — Other Ambulatory Visit: Payer: Self-pay

## 2019-02-25 ENCOUNTER — Ambulatory Visit (INDEPENDENT_AMBULATORY_CARE_PROVIDER_SITE_OTHER): Payer: 59 | Admitting: Family Medicine

## 2019-02-25 ENCOUNTER — Encounter: Payer: Self-pay | Admitting: Family Medicine

## 2019-02-25 ENCOUNTER — Other Ambulatory Visit: Payer: Self-pay

## 2019-02-25 VITALS — BP 116/70 | HR 75 | Temp 98.0°F | Ht 68.25 in | Wt 211.7 lb

## 2019-02-25 DIAGNOSIS — Z0000 Encounter for general adult medical examination without abnormal findings: Secondary | ICD-10-CM | POA: Diagnosis not present

## 2019-02-25 DIAGNOSIS — Z125 Encounter for screening for malignant neoplasm of prostate: Secondary | ICD-10-CM

## 2019-02-25 LAB — HEPATIC FUNCTION PANEL
ALT: 26 U/L (ref 0–53)
AST: 24 U/L (ref 0–37)
Albumin: 4.7 g/dL (ref 3.5–5.2)
Alkaline Phosphatase: 56 U/L (ref 39–117)
Bilirubin, Direct: 0.1 mg/dL (ref 0.0–0.3)
Total Bilirubin: 0.5 mg/dL (ref 0.2–1.2)
Total Protein: 7.2 g/dL (ref 6.0–8.3)

## 2019-02-25 LAB — CBC WITH DIFFERENTIAL/PLATELET
Basophils Absolute: 0 10*3/uL (ref 0.0–0.1)
Basophils Relative: 1.1 % (ref 0.0–3.0)
Eosinophils Absolute: 0.1 10*3/uL (ref 0.0–0.7)
Eosinophils Relative: 1.7 % (ref 0.0–5.0)
HCT: 41.2 % (ref 39.0–52.0)
Hemoglobin: 14.1 g/dL (ref 13.0–17.0)
Lymphocytes Relative: 32.6 % (ref 12.0–46.0)
Lymphs Abs: 1.4 10*3/uL (ref 0.7–4.0)
MCHC: 34.3 g/dL (ref 30.0–36.0)
MCV: 94.2 fl (ref 78.0–100.0)
Monocytes Absolute: 0.4 10*3/uL (ref 0.1–1.0)
Monocytes Relative: 9.8 % (ref 3.0–12.0)
Neutro Abs: 2.3 10*3/uL (ref 1.4–7.7)
Neutrophils Relative %: 54.8 % (ref 43.0–77.0)
Platelets: 247 10*3/uL (ref 150.0–400.0)
RBC: 4.37 Mil/uL (ref 4.22–5.81)
RDW: 12.6 % (ref 11.5–15.5)
WBC: 4.2 10*3/uL (ref 4.0–10.5)

## 2019-02-25 LAB — LIPID PANEL
Cholesterol: 178 mg/dL (ref 0–200)
HDL: 44.7 mg/dL (ref >39.00)
LDL Cholesterol: 102 mg/dL — ABNORMAL HIGH (ref 0–99)
NonHDL: 133.18
Total CHOL/HDL Ratio: 4
Triglycerides: 157 mg/dL — ABNORMAL HIGH (ref 0.0–149.0)
VLDL: 31.4 mg/dL (ref 0.0–40.0)

## 2019-02-25 LAB — BASIC METABOLIC PANEL
BUN: 12 mg/dL (ref 6–23)
CO2: 26 mEq/L (ref 19–32)
Calcium: 9.5 mg/dL (ref 8.4–10.5)
Chloride: 103 mEq/L (ref 96–112)
Creatinine, Ser: 1.03 mg/dL (ref 0.40–1.50)
GFR: 75.26 mL/min (ref >60.00)
Glucose, Bld: 97 mg/dL (ref 70–99)
Potassium: 4.5 mEq/L (ref 3.5–5.1)
Sodium: 138 mEq/L (ref 135–145)

## 2019-02-25 LAB — PSA: PSA: 1.3 ng/mL (ref 0.10–4.00)

## 2019-02-25 LAB — TSH: TSH: 1.41 u[IU]/mL (ref 0.35–4.50)

## 2019-02-25 MED ORDER — OLMESARTAN MEDOXOMIL 20 MG PO TABS: 20.0000 mg | ORAL_TABLET | Freq: Every day | ORAL | 3 refills | Status: DC
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
## 2019-02-27 ENCOUNTER — Encounter: Payer: Self-pay | Admitting: Family Medicine

## 2019-07-03 ENCOUNTER — Encounter: Payer: Self-pay | Admitting: Family Medicine

## 2019-10-12 ENCOUNTER — Encounter: Payer: Self-pay | Admitting: Family Medicine

## 2020-02-27 ENCOUNTER — Other Ambulatory Visit: Payer: Self-pay

## 2020-02-27 ENCOUNTER — Ambulatory Visit (INDEPENDENT_AMBULATORY_CARE_PROVIDER_SITE_OTHER): Payer: 59 | Admitting: Family Medicine

## 2020-02-27 ENCOUNTER — Encounter: Payer: Self-pay | Admitting: Family Medicine

## 2020-02-27 VITALS — BP 122/66 | HR 76 | Temp 98.4°F | Ht 68.0 in | Wt 216.3 lb

## 2020-02-27 DIAGNOSIS — Z0000 Encounter for general adult medical examination without abnormal findings: Secondary | ICD-10-CM

## 2020-02-27 MED ORDER — METHOCARBAMOL 500 MG PO TABS: 500.0000 mg | ORAL_TABLET | Freq: Four times a day (QID) | ORAL | 1 refills | Status: DC | PRN

## 2020-02-27 MED ORDER — OLMESARTAN MEDOXOMIL 20 MG PO TABS: 20.0000 mg | ORAL_TABLET | Freq: Every day | ORAL | 3 refills | Status: DC
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
## 2020-02-27 NOTE — Patient Instructions (Signed)
Preventive Care 55-55 Years Old, Male Preventive care refers to lifestyle choices and visits with your health care provider that can promote health and wellness. This includes:  A yearly physical exam. This is also called an annual well check.  Regular dental and eye exams.  Immunizations.  Screening for certain conditions.  Healthy lifestyle choices, such as eating a healthy diet, getting regular exercise, not using drugs or products that contain nicotine and tobacco, and limiting alcohol use. What can I expect for my preventive care visit? Physical exam Your health care provider will check:  Height and weight. These may be used to calculate body mass index (BMI), which is a measurement that tells if you are at a healthy weight.  Heart rate and blood pressure.  Your skin for abnormal spots. Counseling Your health care provider may ask you questions about:  Alcohol, tobacco, and drug use.  Emotional well-being.  Home and relationship well-being.  Sexual activity.  Eating habits.  Work and work Statistician. What immunizations do I need?  Influenza (flu) vaccine  This is recommended every year. Tetanus, diphtheria, and pertussis (Tdap) vaccine  You may need a Td booster every 10 years. Varicella (chickenpox) vaccine  You may need this vaccine if you have not already been vaccinated. Zoster (shingles) vaccine  You may need this after age 55. Measles, mumps, and rubella (MMR) vaccine  You may need at least one dose of MMR if you were born in 1957 or later. You may also need a second dose. Pneumococcal conjugate (PCV13) vaccine  You may need this if you have certain conditions and were not previously vaccinated. Pneumococcal polysaccharide (PPSV23) vaccine  You may need one or two doses if you smoke cigarettes or if you have certain conditions. Meningococcal conjugate (MenACWY) vaccine  You may need this if you have certain conditions. Hepatitis A  vaccine  You may need this if you have certain conditions or if you travel or work in places where you may be exposed to hepatitis A. Hepatitis B vaccine  You may need this if you have certain conditions or if you travel or work in places where you may be exposed to hepatitis B. Haemophilus influenzae type b (Hib) vaccine  You may need this if you have certain risk factors. Human papillomavirus (HPV) vaccine  If recommended by your health care provider, you may need three doses over 6 months. You may receive vaccines as individual doses or as more than one vaccine together in one shot (combination vaccines). Talk with your health care provider about the risks and benefits of combination vaccines. What tests do I need? Blood tests  Lipid and cholesterol levels. These may be checked every 5 years, or more frequently if you are over 60 years old.  Hepatitis C test.  Hepatitis B test. Screening  Lung cancer screening. You may have this screening every year starting at age 55 if you have a 30-pack-year history of smoking and currently smoke or have quit within the past 15 years.  Prostate cancer screening. Recommendations will vary depending on your family history and other risks.  Colorectal cancer screening. All adults should have this screening starting at age 72 and continuing until age 2. Your health care provider may recommend screening at age 14 if you are at increased risk. You will have tests every 1-10 years, depending on your results and the type of screening test.  Diabetes screening. This is done by checking your blood sugar (glucose) after you have not eaten  for a while (fasting). You may have this done every 1-3 years.  Sexually transmitted disease (STD) testing. Follow these instructions at home: Eating and drinking  Eat a diet that includes fresh fruits and vegetables, whole grains, lean protein, and low-fat dairy products.  Take vitamin and mineral supplements as  recommended by your health care provider.  Do not drink alcohol if your health care provider tells you not to drink.  If you drink alcohol: ? Limit how much you have to 0-2 drinks a day. ? Be aware of how much alcohol is in your drink. In the [place] one drink equals one 12 oz bottle of beer (355 mL), one 5 oz glass of wine (148 mL), or one 1 oz glass of hard liquor (44 mL). Lifestyle  Take daily care of your teeth and gums.  Stay active. Exercise for at least 30 minutes on 5 or more days each week.  Do not use any products that contain nicotine or tobacco, such as cigarettes, e-cigarettes, and chewing tobacco. If you need help quitting, ask your health care provider.  If you are sexually active, practice safe sex. Use a condom or other form of protection to prevent STIs (sexually transmitted infections).  Talk with your health care provider about taking a low-dose aspirin every day starting at age 55. What's next?  Go to your health care provider once a year for a well check visit.  Ask your health care provider how often you should have your eyes and teeth checked.  Stay up to date on all vaccines. This information is not intended to replace advice given to you by your health care provider. Make sure you discuss any questions you have with your health care provider. Document Revised: 07/01/2018 Document Reviewed: 07/01/2018 Elsevier Patient Education  2020 [employer].

## 2020-02-28 ENCOUNTER — Encounter: Payer: Self-pay | Admitting: Family Medicine

## 2020-02-28 DIAGNOSIS — R718 Other abnormality of red blood cells: Secondary | ICD-10-CM

## 2020-02-28 LAB — HEPATIC FUNCTION PANEL
AG Ratio: 1.9 (calc) (ref 1.0–2.5)
ALT: 22 U/L (ref 9–46)
AST: 24 U/L (ref 10–35)
Albumin: 4.4 g/dL (ref 3.6–5.1)
Alkaline phosphatase (APISO): 49 U/L (ref 35–144)
Bilirubin, Direct: 0.1 mg/dL (ref 0.0–0.2)
Globulin: 2.3 g/dL (calc) (ref 1.9–3.7)
Indirect Bilirubin: 0.5 mg/dL (calc) (ref 0.2–1.2)
Total Bilirubin: 0.6 mg/dL (ref 0.2–1.2)
Total Protein: 6.7 g/dL (ref 6.1–8.1)

## 2020-02-28 LAB — TSH: TSH: 1.43 mIU/L (ref 0.40–4.50)

## 2020-02-28 LAB — CBC WITH DIFFERENTIAL/PLATELET
Absolute Monocytes: 447 cells/uL (ref 200–950)
Basophils Absolute: 28 cells/uL (ref 0–200)
Basophils Relative: 0.6 %
Eosinophils Absolute: 127 cells/uL (ref 15–500)
Eosinophils Relative: 2.7 %
HCT: 39.8 % (ref 38.5–50.0)
Hemoglobin: 13.4 g/dL (ref 13.2–17.1)
Lymphs Abs: 1894 cells/uL (ref 850–3900)
MCH: 32 pg (ref 27.0–33.0)
MCHC: 33.7 g/dL (ref 32.0–36.0)
MCV: 95 fL (ref 80.0–100.0)
MPV: 10.5 fL (ref 7.5–12.5)
Monocytes Relative: 9.5 %
Neutro Abs: 2204 cells/uL (ref 1500–7800)
Neutrophils Relative %: 46.9 %
Platelets: 238 10*3/uL (ref 140–400)
RBC: 4.19 10*6/uL — ABNORMAL LOW (ref 4.20–5.80)
RDW: 12 % (ref 11.0–15.0)
Total Lymphocyte: 40.3 %
WBC: 4.7 10*3/uL (ref 3.8–10.8)

## 2020-02-28 LAB — HEPATITIS C ANTIBODY
Hepatitis C Ab: NONREACTIVE
SIGNAL TO CUT-OFF: 0.01 (ref <1.00)

## 2020-02-28 LAB — LIPID PANEL
Cholesterol: 180 mg/dL (ref <200)
HDL: 60 mg/dL (ref >=40)
LDL Cholesterol (Calc): 101 mg/dL (calc) — ABNORMAL HIGH
Non-HDL Cholesterol (Calc): 120 mg/dL (calc) (ref <130)
Total CHOL/HDL Ratio: 3 (calc) (ref <5.0)
Triglycerides: 91 mg/dL (ref <150)

## 2020-02-28 LAB — BASIC METABOLIC PANEL
BUN: 20 mg/dL (ref 7–25)
CO2: 24 mmol/L (ref 20–32)
Calcium: 9.4 mg/dL (ref 8.6–10.3)
Chloride: 101 mmol/L (ref 98–110)
Creat: 1.23 mg/dL (ref 0.70–1.33)
Glucose, Bld: 102 mg/dL — ABNORMAL HIGH (ref 65–99)
Potassium: 4.3 mmol/L (ref 3.5–5.3)
Sodium: 138 mmol/L (ref 135–146)

## 2020-02-28 LAB — PSA: PSA: 1.1 ng/mL (ref <=4.0)

## 2020-04-13 ENCOUNTER — Encounter: Payer: Self-pay | Admitting: Family Medicine

## 2020-05-29 ENCOUNTER — Encounter: Payer: Self-pay | Admitting: Family Medicine

## 2020-05-29 ENCOUNTER — Other Ambulatory Visit (INDEPENDENT_AMBULATORY_CARE_PROVIDER_SITE_OTHER): Payer: 59

## 2020-05-29 ENCOUNTER — Other Ambulatory Visit: Payer: Self-pay

## 2020-05-29 DIAGNOSIS — R718 Other abnormality of red blood cells: Secondary | ICD-10-CM

## 2020-05-29 LAB — CBC WITH DIFFERENTIAL/PLATELET
Basophils Absolute: 0 10*3/uL (ref 0.0–0.1)
Basophils Relative: 0.9 % (ref 0.0–3.0)
Eosinophils Absolute: 0.1 10*3/uL (ref 0.0–0.7)
Eosinophils Relative: 2.9 % (ref 0.0–5.0)
HCT: 41 % (ref 39.0–52.0)
Hemoglobin: 14 g/dL (ref 13.0–17.0)
Lymphocytes Relative: 39.6 % (ref 12.0–46.0)
Lymphs Abs: 1.8 10*3/uL (ref 0.7–4.0)
MCHC: 34.1 g/dL (ref 30.0–36.0)
MCV: 93.4 fl (ref 78.0–100.0)
Monocytes Absolute: 0.4 10*3/uL (ref 0.1–1.0)
Monocytes Relative: 9.4 % (ref 3.0–12.0)
Neutro Abs: 2.1 10*3/uL (ref 1.4–7.7)
Neutrophils Relative %: 47.2 % (ref 43.0–77.0)
Platelets: 236 10*3/uL (ref 150.0–400.0)
RBC: 4.39 Mil/uL (ref 4.22–5.81)
RDW: 13.1 % (ref 11.5–15.5)
WBC: 4.5 10*3/uL (ref 4.0–10.5)

## 2020-05-29 NOTE — Addendum Note (Signed)
Addended by: Marrion Coy on: 05/29/2020 08:21 AM   Modules accepted: Orders

## 2020-05-31 ENCOUNTER — Other Ambulatory Visit: Payer: Self-pay | Admitting: Family Medicine

## 2020-06-04 NOTE — Telephone Encounter (Signed)
May refill once. 

## 2021-01-10 ENCOUNTER — Other Ambulatory Visit: Payer: Self-pay | Admitting: Family Medicine

## 2021-01-16 ENCOUNTER — Encounter: Payer: Self-pay | Admitting: Family Medicine

## 2021-02-28 ENCOUNTER — Other Ambulatory Visit: Payer: Self-pay

## 2021-03-01 ENCOUNTER — Ambulatory Visit (INDEPENDENT_AMBULATORY_CARE_PROVIDER_SITE_OTHER): Payer: 59 | Admitting: Family Medicine

## 2021-03-01 ENCOUNTER — Encounter: Payer: Self-pay | Admitting: Family Medicine

## 2021-03-01 VITALS — BP 130/78 | HR 70 | Temp 97.9°F | Ht 68.0 in | Wt 216.0 lb

## 2021-03-01 DIAGNOSIS — Z0000 Encounter for general adult medical examination without abnormal findings: Secondary | ICD-10-CM

## 2021-03-01 DIAGNOSIS — Z23 Encounter for immunization: Secondary | ICD-10-CM | POA: Diagnosis not present

## 2021-03-01 LAB — CBC WITH DIFFERENTIAL/PLATELET
Basophils Absolute: 0 10*3/uL (ref 0.0–0.1)
Basophils Relative: 1.1 % (ref 0.0–3.0)
Eosinophils Absolute: 0.1 10*3/uL (ref 0.0–0.7)
Eosinophils Relative: 2.9 % (ref 0.0–5.0)
HCT: 40.1 % (ref 39.0–52.0)
Hemoglobin: 13.5 g/dL (ref 13.0–17.0)
Lymphocytes Relative: 31.1 % (ref 12.0–46.0)
Lymphs Abs: 1.4 10*3/uL (ref 0.7–4.0)
MCHC: 33.6 g/dL (ref 30.0–36.0)
MCV: 96 fl (ref 78.0–100.0)
Monocytes Absolute: 0.4 10*3/uL (ref 0.1–1.0)
Monocytes Relative: 9.4 % (ref 3.0–12.0)
Neutro Abs: 2.5 10*3/uL (ref 1.4–7.7)
Neutrophils Relative %: 55.5 % (ref 43.0–77.0)
Platelets: 236 10*3/uL (ref 150.0–400.0)
RBC: 4.18 Mil/uL — ABNORMAL LOW (ref 4.22–5.81)
RDW: 12.6 % (ref 11.5–15.5)
WBC: 4.4 10*3/uL (ref 4.0–10.5)

## 2021-03-01 LAB — PSA: PSA: 2.61 ng/mL (ref 0.10–4.00)

## 2021-03-01 LAB — LIPID PANEL
Cholesterol: 193 mg/dL (ref 0–200)
HDL: 54 mg/dL (ref >39.00)
LDL Cholesterol: 109 mg/dL — ABNORMAL HIGH (ref 0–99)
NonHDL: 139.29
Total CHOL/HDL Ratio: 4
Triglycerides: 150 mg/dL — ABNORMAL HIGH (ref 0.0–149.0)
VLDL: 30 mg/dL (ref 0.0–40.0)

## 2021-03-01 LAB — HEPATIC FUNCTION PANEL
ALT: 29 U/L (ref 0–53)
AST: 25 U/L (ref 0–37)
Albumin: 4.4 g/dL (ref 3.5–5.2)
Alkaline Phosphatase: 51 U/L (ref 39–117)
Bilirubin, Direct: 0.1 mg/dL (ref 0.0–0.3)
Total Bilirubin: 0.6 mg/dL (ref 0.2–1.2)
Total Protein: 6.9 g/dL (ref 6.0–8.3)

## 2021-03-01 LAB — BASIC METABOLIC PANEL
BUN: 13 mg/dL (ref 6–23)
CO2: 29 mEq/L (ref 19–32)
Calcium: 9.2 mg/dL (ref 8.4–10.5)
Chloride: 103 mEq/L (ref 96–112)
Creatinine, Ser: 1.04 mg/dL (ref 0.40–1.50)
GFR: 80.56 mL/min (ref >60.00)
Glucose, Bld: 97 mg/dL (ref 70–99)
Potassium: 4.7 mEq/L (ref 3.5–5.1)
Sodium: 139 mEq/L (ref 135–145)

## 2021-03-01 LAB — TSH: TSH: 1.91 u[IU]/mL (ref 0.35–5.50)

## 2021-03-01 LAB — VITAMIN B12: Vitamin B-12: >1550 pg/mL — ABNORMAL HIGH (ref 211–911)

## 2021-03-01 MED ORDER — OLMESARTAN MEDOXOMIL 20 MG PO TABS: 20.0000 mg | ORAL_TABLET | Freq: Every day | ORAL | 3 refills | Status: DC

## 2021-03-01 NOTE — Addendum Note (Signed)
Addended by: Amanda Cockayne on: 03/01/2021 07:57 AM   Modules accepted: Orders
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
## 2021-03-05 ENCOUNTER — Encounter: Payer: Self-pay | Admitting: Family Medicine

## 2021-03-07 ENCOUNTER — Other Ambulatory Visit: Payer: Self-pay

## 2021-03-07 DIAGNOSIS — R972 Elevated prostate specific antigen [PSA]: Secondary | ICD-10-CM

## 2021-03-21 ENCOUNTER — Encounter: Payer: Self-pay | Admitting: Family Medicine

## 2021-04-16 ENCOUNTER — Other Ambulatory Visit: Payer: Self-pay | Admitting: Family Medicine

## 2021-06-04 ENCOUNTER — Encounter: Payer: Self-pay | Admitting: Family Medicine

## 2021-06-04 ENCOUNTER — Other Ambulatory Visit: Payer: Self-pay | Admitting: Family Medicine

## 2021-06-04 DIAGNOSIS — Z0000 Encounter for general adult medical examination without abnormal findings: Secondary | ICD-10-CM

## 2021-06-04 MED ORDER — OLMESARTAN MEDOXOMIL 20 MG PO TABS: 20.0000 mg | ORAL_TABLET | Freq: Every day | ORAL | 3 refills | Status: DC

## 2021-06-04 MED ORDER — METHOCARBAMOL 500 MG PO TABS
500.0000 mg | ORAL_TABLET | Freq: Four times a day (QID) | ORAL | 0 refills | Status: DC | PRN
Start: 2021-06-04 — End: 2021-07-05

## 2021-06-22 ENCOUNTER — Encounter: Payer: Self-pay | Admitting: Family Medicine

## 2021-07-01 ENCOUNTER — Other Ambulatory Visit (INDEPENDENT_AMBULATORY_CARE_PROVIDER_SITE_OTHER): Payer: Managed Care, Other (non HMO)

## 2021-07-01 DIAGNOSIS — R972 Elevated prostate specific antigen [PSA]: Secondary | ICD-10-CM

## 2021-07-01 LAB — PSA: PSA: 1.28 ng/mL (ref 0.10–4.00)

## 2021-07-05 MED ORDER — METHOCARBAMOL 500 MG PO TABS: 500.0000 mg | ORAL_TABLET | Freq: Four times a day (QID) | ORAL | 0 refills | Status: DC | PRN

## 2021-07-05 NOTE — Addendum Note (Signed)
Addended by: Rebecca Eaton on: 07/05/2021 07:15 AM   Modules accepted: Orders

## 2021-08-02 ENCOUNTER — Other Ambulatory Visit: Payer: Self-pay | Admitting: Family Medicine

## 2021-09-01 ENCOUNTER — Encounter: Payer: Self-pay | Admitting: Family Medicine

## 2021-09-01 DIAGNOSIS — Z0000 Encounter for general adult medical examination without abnormal findings: Secondary | ICD-10-CM

## 2021-09-02 MED ORDER — METHOCARBAMOL 500 MG PO TABS: 500.0000 mg | ORAL_TABLET | Freq: Four times a day (QID) | ORAL | 0 refills | Status: DC | PRN

## 2021-09-02 MED ORDER — OLMESARTAN MEDOXOMIL 20 MG PO TABS: 20.0000 mg | ORAL_TABLET | Freq: Every day | ORAL | 3 refills | Status: DC

## 2021-09-02 NOTE — Addendum Note (Signed)
Addended by: Rebecca Eaton on: 09/02/2021 03:44 PM   Modules accepted: Orders

## 2021-10-01 ENCOUNTER — Other Ambulatory Visit: Payer: Self-pay | Admitting: Family Medicine

## 2021-10-31 ENCOUNTER — Other Ambulatory Visit: Payer: Self-pay | Admitting: Family Medicine

## 2021-11-01 ENCOUNTER — Encounter: Payer: Self-pay | Admitting: Family Medicine

## 2021-11-01 ENCOUNTER — Other Ambulatory Visit: Payer: Self-pay | Admitting: Family Medicine

## 2021-11-01 ENCOUNTER — Other Ambulatory Visit: Payer: Self-pay

## 2021-11-01 MED ORDER — METHOCARBAMOL 500 MG PO TABS: ORAL_TABLET | ORAL | 1 refills | Status: DC

## 2021-11-01 MED ORDER — METHOCARBAMOL 500 MG PO TABS: ORAL_TABLET | ORAL | 0 refills | Status: DC

## 2021-11-01 NOTE — Telephone Encounter (Signed)
I sent in #[phone] of these  ?

## 2021-11-01 NOTE — Telephone Encounter (Signed)
Last OV: 03/01/21 ?Next OV: none scheduled ?

## 2021-11-01 NOTE — Addendum Note (Signed)
Addended by: Alysia Penna A on: 11/01/2021 04:13 PM ? ? Modules accepted: Orders ? ?

## 2021-11-01 NOTE — Telephone Encounter (Signed)
Pt is aware md out of office until monday ?

## 2022-01-02 ENCOUNTER — Other Ambulatory Visit: Payer: Self-pay | Admitting: Family Medicine

## 2022-01-02 DIAGNOSIS — Z0000 Encounter for general adult medical examination without abnormal findings: Secondary | ICD-10-CM

## 2022-01-03 ENCOUNTER — Other Ambulatory Visit: Payer: Self-pay | Admitting: Family Medicine

## 2022-02-02 ENCOUNTER — Other Ambulatory Visit: Payer: Self-pay | Admitting: Family Medicine

## 2022-02-02 DIAGNOSIS — Z0000 Encounter for general adult medical examination without abnormal findings: Secondary | ICD-10-CM

## 2022-03-03 ENCOUNTER — Ambulatory Visit (INDEPENDENT_AMBULATORY_CARE_PROVIDER_SITE_OTHER): Payer: Managed Care, Other (non HMO) | Admitting: Family Medicine

## 2022-03-03 ENCOUNTER — Encounter: Payer: Self-pay | Admitting: Family Medicine

## 2022-03-03 VITALS — BP 136/66 | HR 61 | Temp 98.4°F | Ht 69.0 in | Wt 195.4 lb

## 2022-03-03 DIAGNOSIS — Z0000 Encounter for general adult medical examination without abnormal findings: Secondary | ICD-10-CM | POA: Diagnosis not present

## 2022-03-03 LAB — CBC WITH DIFFERENTIAL/PLATELET
Basophils Absolute: 0 10*3/uL (ref 0.0–0.1)
Basophils Relative: 1.1 % (ref 0.0–3.0)
Eosinophils Absolute: 0.1 10*3/uL (ref 0.0–0.7)
Eosinophils Relative: 1.7 % (ref 0.0–5.0)
HCT: 40.8 % (ref 39.0–52.0)
Hemoglobin: 13.8 g/dL (ref 13.0–17.0)
Lymphocytes Relative: 31.4 % (ref 12.0–46.0)
Lymphs Abs: 1.2 10*3/uL (ref 0.7–4.0)
MCHC: 33.9 g/dL (ref 30.0–36.0)
MCV: 96.5 fl (ref 78.0–100.0)
Monocytes Absolute: 0.4 10*3/uL (ref 0.1–1.0)
Monocytes Relative: 9.7 % (ref 3.0–12.0)
Neutro Abs: 2.2 10*3/uL (ref 1.4–7.7)
Neutrophils Relative %: 56.1 % (ref 43.0–77.0)
Platelets: 220 10*3/uL (ref 150.0–400.0)
RBC: 4.22 Mil/uL (ref 4.22–5.81)
RDW: 13.1 % (ref 11.5–15.5)
WBC: 3.9 10*3/uL — ABNORMAL LOW (ref 4.0–10.5)

## 2022-03-03 LAB — TSH: TSH: 2.17 u[IU]/mL (ref 0.35–5.50)

## 2022-03-03 LAB — BASIC METABOLIC PANEL
BUN: 13 mg/dL (ref 6–23)
CO2: 26 mEq/L (ref 19–32)
Calcium: 9.6 mg/dL (ref 8.4–10.5)
Chloride: 101 mEq/L (ref 96–112)
Creatinine, Ser: 0.99 mg/dL (ref 0.40–1.50)
GFR: 84.86 mL/min (ref >60.00)
Glucose, Bld: 101 mg/dL — ABNORMAL HIGH (ref 70–99)
Potassium: 4.3 mEq/L (ref 3.5–5.1)
Sodium: 137 mEq/L (ref 135–145)

## 2022-03-03 LAB — LIPID PANEL
Cholesterol: 159 mg/dL (ref 0–200)
HDL: 64.4 mg/dL (ref >39.00)
LDL Cholesterol: 68 mg/dL (ref 0–99)
NonHDL: 94.72
Total CHOL/HDL Ratio: 2
Triglycerides: 134 mg/dL (ref 0.0–149.0)
VLDL: 26.8 mg/dL (ref 0.0–40.0)

## 2022-03-03 LAB — HEPATIC FUNCTION PANEL
ALT: 24 U/L (ref 0–53)
AST: 29 U/L (ref 0–37)
Albumin: 4.7 g/dL (ref 3.5–5.2)
Alkaline Phosphatase: 62 U/L (ref 39–117)
Bilirubin, Direct: 0.2 mg/dL (ref 0.0–0.3)
Total Bilirubin: 0.9 mg/dL (ref 0.2–1.2)
Total Protein: 7.2 g/dL (ref 6.0–8.3)

## 2022-03-03 LAB — PSA: PSA: 1.07 ng/mL (ref 0.10–4.00)

## 2022-03-03 MED ORDER — METHOCARBAMOL 500 MG PO TABS: ORAL_TABLET | ORAL | 0 refills | Status: DC

## 2022-03-03 MED ORDER — OLMESARTAN MEDOXOMIL 20 MG PO TABS: 20.0000 mg | ORAL_TABLET | Freq: Every day | ORAL | 3 refills | Status: DC
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
## 2022-03-04 ENCOUNTER — Encounter: Payer: Self-pay | Admitting: Family Medicine

## 2022-05-26 ENCOUNTER — Encounter: Payer: Self-pay | Admitting: Family Medicine

## 2022-05-27 ENCOUNTER — Other Ambulatory Visit: Payer: Self-pay | Admitting: Family Medicine

## 2022-06-19 ENCOUNTER — Encounter: Payer: Self-pay | Admitting: Family Medicine

## 2022-08-24 ENCOUNTER — Other Ambulatory Visit: Payer: Self-pay | Admitting: Family Medicine

## 2022-09-04 ENCOUNTER — Encounter: Payer: Self-pay | Admitting: Family Medicine

## 2022-09-04 DIAGNOSIS — L989 Disorder of the skin and subcutaneous tissue, unspecified: Secondary | ICD-10-CM

## 2022-09-05 ENCOUNTER — Ambulatory Visit (INDEPENDENT_AMBULATORY_CARE_PROVIDER_SITE_OTHER): Payer: Managed Care, Other (non HMO) | Admitting: Family Medicine

## 2022-09-05 ENCOUNTER — Encounter: Payer: Self-pay | Admitting: Family Medicine

## 2022-09-05 VITALS — BP 126/68 | HR 75 | Temp 98.0°F | Ht 69.0 in | Wt 216.5 lb

## 2022-09-05 DIAGNOSIS — L989 Disorder of the skin and subcutaneous tissue, unspecified: Secondary | ICD-10-CM

## 2022-09-05 NOTE — Patient Instructions (Signed)
?  Early seborrheic keratosis  I will set up dermatology referral and try to get in to see Dr Karin Golden.
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
## 2022-09-16 NOTE — Telephone Encounter (Signed)
Referral placed.

## 2022-11-20 ENCOUNTER — Other Ambulatory Visit: Payer: Self-pay | Admitting: Family Medicine

## 2022-11-21 NOTE — Telephone Encounter (Signed)
Yes ok 

## 2022-12-16 ENCOUNTER — Encounter: Payer: Self-pay | Admitting: Family Medicine

## 2023-02-11 ENCOUNTER — Encounter: Payer: Self-pay | Admitting: Family Medicine

## 2023-02-18 ENCOUNTER — Encounter (INDEPENDENT_AMBULATORY_CARE_PROVIDER_SITE_OTHER): Payer: Self-pay

## 2023-02-22 ENCOUNTER — Other Ambulatory Visit: Payer: Self-pay | Admitting: Family Medicine

## 2023-02-22 ENCOUNTER — Encounter: Payer: Self-pay | Admitting: Family Medicine

## 2023-02-22 DIAGNOSIS — Z0000 Encounter for general adult medical examination without abnormal findings: Secondary | ICD-10-CM

## 2023-03-03 ENCOUNTER — Encounter: Payer: Managed Care, Other (non HMO) | Admitting: Family Medicine

## 2023-03-10 ENCOUNTER — Encounter: Payer: Managed Care, Other (non HMO) | Admitting: Family Medicine

## 2023-03-17 ENCOUNTER — Ambulatory Visit (INDEPENDENT_AMBULATORY_CARE_PROVIDER_SITE_OTHER): Payer: Managed Care, Other (non HMO) | Admitting: Family Medicine

## 2023-03-17 ENCOUNTER — Encounter: Payer: Self-pay | Admitting: Family Medicine

## 2023-03-17 VITALS — BP 130/80 | HR 77 | Temp 98.1°F | Ht 68.5 in | Wt 214.2 lb

## 2023-03-17 DIAGNOSIS — Z0000 Encounter for general adult medical examination without abnormal findings: Secondary | ICD-10-CM

## 2023-03-17 DIAGNOSIS — R972 Elevated prostate specific antigen [PSA]: Secondary | ICD-10-CM | POA: Diagnosis not present

## 2023-03-17 LAB — LIPID PANEL
Cholesterol: 168 mg/dL (ref 0–200)
HDL: 56.5 mg/dL (ref >39.00)
LDL Cholesterol: 82 mg/dL (ref 0–99)
NonHDL: 111.16
Total CHOL/HDL Ratio: 3
Triglycerides: 147 mg/dL (ref 0.0–149.0)
VLDL: 29.4 mg/dL (ref 0.0–40.0)

## 2023-03-17 LAB — BASIC METABOLIC PANEL
BUN: 13 mg/dL (ref 6–23)
CO2: 27 mEq/L (ref 19–32)
Calcium: 9.9 mg/dL (ref 8.4–10.5)
Chloride: 101 mEq/L (ref 96–112)
Creatinine, Ser: 1.12 mg/dL (ref 0.40–1.50)
GFR: 72.65 mL/min (ref >60.00)
Glucose, Bld: 97 mg/dL (ref 70–99)
Potassium: 4.8 mEq/L (ref 3.5–5.1)
Sodium: 138 mEq/L (ref 135–145)

## 2023-03-17 LAB — CBC WITH DIFFERENTIAL/PLATELET
Basophils Absolute: 0 10*3/uL (ref 0.0–0.1)
Basophils Relative: 0.6 % (ref 0.0–3.0)
Eosinophils Absolute: 0.1 10*3/uL (ref 0.0–0.7)
Eosinophils Relative: 1.9 % (ref 0.0–5.0)
HCT: 42.5 % (ref 39.0–52.0)
Hemoglobin: 14.3 g/dL (ref 13.0–17.0)
Lymphocytes Relative: 31.7 % (ref 12.0–46.0)
Lymphs Abs: 2 10*3/uL (ref 0.7–4.0)
MCHC: 33.6 g/dL (ref 30.0–36.0)
MCV: 94.7 fl (ref 78.0–100.0)
Monocytes Absolute: 0.5 10*3/uL (ref 0.1–1.0)
Monocytes Relative: 8.1 % (ref 3.0–12.0)
Neutro Abs: 3.7 10*3/uL (ref 1.4–7.7)
Neutrophils Relative %: 57.7 % (ref 43.0–77.0)
Platelets: 248 10*3/uL (ref 150.0–400.0)
RBC: 4.49 Mil/uL (ref 4.22–5.81)
RDW: 12.5 % (ref 11.5–15.5)
WBC: 6.4 10*3/uL (ref 4.0–10.5)

## 2023-03-17 LAB — HEPATIC FUNCTION PANEL
ALT: 30 U/L (ref 0–53)
AST: 29 U/L (ref 0–37)
Albumin: 4.7 g/dL (ref 3.5–5.2)
Alkaline Phosphatase: 58 U/L (ref 39–117)
Bilirubin, Direct: 0.2 mg/dL (ref 0.0–0.3)
Total Bilirubin: 0.8 mg/dL (ref 0.2–1.2)
Total Protein: 7.3 g/dL (ref 6.0–8.3)

## 2023-03-17 LAB — PSA: PSA: 3.25 ng/mL (ref 0.10–4.00)
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
## 2023-03-20 NOTE — Addendum Note (Signed)
Addended by: Christy Sartorius on: 03/20/2023 07:09 AM   Modules accepted: Orders

## 2023-05-27 ENCOUNTER — Other Ambulatory Visit: Payer: Self-pay | Admitting: Family Medicine

## 2023-05-27 DIAGNOSIS — Z0000 Encounter for general adult medical examination without abnormal findings: Secondary | ICD-10-CM

## 2023-05-28 ENCOUNTER — Encounter: Payer: Self-pay | Admitting: Family Medicine

## 2023-05-28 DIAGNOSIS — Z0000 Encounter for general adult medical examination without abnormal findings: Secondary | ICD-10-CM

## 2023-05-28 MED ORDER — OLMESARTAN MEDOXOMIL 20 MG PO TABS
20.0000 mg | ORAL_TABLET | Freq: Every day | ORAL | 2 refills | Status: DC
Start: 2023-05-28 — End: 2024-05-18

## 2023-05-28 NOTE — Telephone Encounter (Signed)
Noted  

## 2023-07-08 ENCOUNTER — Other Ambulatory Visit: Payer: Managed Care, Other (non HMO)

## 2023-07-08 DIAGNOSIS — R972 Elevated prostate specific antigen [PSA]: Secondary | ICD-10-CM

## 2023-07-08 LAB — PSA: PSA: 1.17 ng/mL (ref 0.10–4.00)

## 2024-01-12 ENCOUNTER — Ambulatory Visit: Payer: Self-pay

## 2024-01-12 ENCOUNTER — Encounter: Payer: Self-pay | Admitting: Family Medicine

## 2024-01-12 NOTE — Telephone Encounter (Signed)
 FYI Only or Action Required?: FYI only for provider.  Patient was last seen in primary care on 03/17/2023 by Micheal Wolm ORN, MD. Called Nurse Triage reporting Poison Sumac. Symptoms began a week ago. Interventions attempted: OTC medications: hydrocortisone cream. Symptoms are: gradually worsening.  Triage Disposition: See Physician Within 24 Hours  Patient/caregiver understands and will follow disposition?: Yes    Copied from CRM [id number]. Topic: Clinical - Red Word Triage >> Jan 12, 2024  4:47 PM Berneda FALCON wrote: Red Word that prompted transfer to Nurse Triage: Pt states he got sumac poisoning in many places on his body. I asked several times and it seems like he did not understand the question. States Yes.  This has been ongoing for about a week.  Red and swollen Reason for Disposition  MODERATE to SEVERE itching (e.g., interferes with work, school, sleep, or other activities)  Answer Assessment - Initial Assessment Questions 1. APPEARANCE of RASH: Describe the rash.      Red, swollen areas 2. LOCATION: Where is the rash located?  (e.g., face, genitals, hands, legs)     Arms, face, abd 3. SIZE: How large is the rash?      N/a 4. ONSET: When did the rash begin?      X 1 week 5. ITCHING: Does the rash itch? If Yes, ask: How bad is it?   - MILD - doesn't interfere with normal activities   - MODERATE-SEVERE: interferes with work, school, sleep, or other activities      Moderate  6. EXPOSURE:  How were you exposed to the plant (poison ivy, poison oak, sumac)  When were you exposed?       work 7. PAST HISTORY: Have you had a poison ivy rash before? If Yes, ask: How bad was it?     Yes: poison ivy & oak and it was mild 8. PREGNANCY: Is there any chance you are pregnant? When was your last menstrual period?     N/a  Protocols used: Poison Ivy - Oak - Sumac-A-AH

## 2024-01-13 ENCOUNTER — Ambulatory Visit (INDEPENDENT_AMBULATORY_CARE_PROVIDER_SITE_OTHER): Admitting: Family Medicine

## 2024-01-13 ENCOUNTER — Encounter: Payer: Self-pay | Admitting: Family Medicine

## 2024-01-13 VITALS — BP 122/78 | HR 82 | Temp 98.4°F | Wt 216.4 lb

## 2024-01-13 DIAGNOSIS — L255 Unspecified contact dermatitis due to plants, except food: Secondary | ICD-10-CM

## 2024-01-13 MED ORDER — TRIAMCINOLONE ACETONIDE 0.1 % EX CREA: 1.0000 | TOPICAL_CREAM | Freq: Two times a day (BID) | CUTANEOUS | 0 refills | Status: DC

## 2024-01-13 MED ORDER — PREDNISONE 10 MG PO TABS: ORAL_TABLET | ORAL | 0 refills | Status: DC
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
## 2024-03-18 ENCOUNTER — Ambulatory Visit (INDEPENDENT_AMBULATORY_CARE_PROVIDER_SITE_OTHER): Admitting: Family Medicine

## 2024-03-18 ENCOUNTER — Encounter: Payer: Self-pay | Admitting: Family Medicine

## 2024-03-18 VITALS — BP 134/74 | HR 56 | Temp 97.7°F | Ht 69.0 in | Wt 212.9 lb

## 2024-03-18 DIAGNOSIS — Z0000 Encounter for general adult medical examination without abnormal findings: Secondary | ICD-10-CM

## 2024-03-18 LAB — BASIC METABOLIC PANEL WITH GFR
BUN: 15 mg/dL (ref 6–23)
CO2: 28 meq/L (ref 19–32)
Calcium: 9.4 mg/dL (ref 8.4–10.5)
Chloride: 102 meq/L (ref 96–112)
Creatinine, Ser: 1.02 mg/dL (ref 0.40–1.50)
GFR: 80.71 mL/min (ref >60.00)
Glucose, Bld: 109 mg/dL — ABNORMAL HIGH (ref 70–99)
Potassium: 4.8 meq/L (ref 3.5–5.1)
Sodium: 139 meq/L (ref 135–145)

## 2024-03-18 LAB — LIPID PANEL
Cholesterol: 135 mg/dL (ref 0–200)
HDL: 55.9 mg/dL (ref >39.00)
LDL Cholesterol: 64 mg/dL (ref 0–99)
NonHDL: 79.35
Total CHOL/HDL Ratio: 2
Triglycerides: 75 mg/dL (ref 0.0–149.0)
VLDL: 15 mg/dL (ref 0.0–40.0)

## 2024-03-18 LAB — HEPATIC FUNCTION PANEL
ALT: 27 U/L (ref 0–53)
AST: 25 U/L (ref 0–37)
Albumin: 4.7 g/dL (ref 3.5–5.2)
Alkaline Phosphatase: 57 U/L (ref 39–117)
Bilirubin, Direct: 0.1 mg/dL (ref 0.0–0.3)
Total Bilirubin: 0.6 mg/dL (ref 0.2–1.2)
Total Protein: 7.2 g/dL (ref 6.0–8.3)

## 2024-03-18 LAB — CBC WITH DIFFERENTIAL/PLATELET
Basophils Absolute: 0 K/uL (ref 0.0–0.1)
Basophils Relative: 1 % (ref 0.0–3.0)
Eosinophils Absolute: 0.8 K/uL — ABNORMAL HIGH (ref 0.0–0.7)
Eosinophils Relative: 15.2 % — ABNORMAL HIGH (ref 0.0–5.0)
HCT: 42.8 % (ref 39.0–52.0)
Hemoglobin: 14.3 g/dL (ref 13.0–17.0)
Lymphocytes Relative: 30.3 % (ref 12.0–46.0)
Lymphs Abs: 1.5 K/uL (ref 0.7–4.0)
MCHC: 33.5 g/dL (ref 30.0–36.0)
MCV: 94.5 fl (ref 78.0–100.0)
Monocytes Absolute: 0.4 K/uL (ref 0.1–1.0)
Monocytes Relative: 8.3 % (ref 3.0–12.0)
Neutro Abs: 2.2 K/uL (ref 1.4–7.7)
Neutrophils Relative %: 45.2 % (ref 43.0–77.0)
Platelets: 211 K/uL (ref 150.0–400.0)
RBC: 4.53 Mil/uL (ref 4.22–5.81)
RDW: 12.6 % (ref 11.5–15.5)
WBC: 4.9 K/uL (ref 4.0–10.5)

## 2024-03-18 LAB — PSA: PSA: 1.11 ng/mL (ref 0.10–4.00)

## 2024-03-18 LAB — TSH: TSH: 1.29 u[IU]/mL (ref 0.35–5.50)
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
## 2024-03-20 ENCOUNTER — Encounter: Payer: Self-pay | Admitting: Family Medicine

## 2024-03-21 ENCOUNTER — Ambulatory Visit: Payer: Self-pay | Admitting: Family Medicine

## 2024-05-17 ENCOUNTER — Other Ambulatory Visit: Payer: Self-pay | Admitting: Family Medicine

## 2024-05-17 DIAGNOSIS — Z0000 Encounter for general adult medical examination without abnormal findings: Secondary | ICD-10-CM

## 2024-06-17 ENCOUNTER — Encounter: Payer: Self-pay | Admitting: Family Medicine

## 2024-08-19 ENCOUNTER — Other Ambulatory Visit: Payer: Self-pay | Admitting: Family Medicine

## 2024-08-19 DIAGNOSIS — Z0000 Encounter for general adult medical examination without abnormal findings: Secondary | ICD-10-CM
